# Patient Record
Sex: Female | Born: 1935 | Race: Black or African American | Hispanic: No | Marital: Single | Smoking: Never smoker
Health system: Southern US, Community
[De-identification: ages and names within clinical notes are randomized; demographics above are authoritative.]

## PROBLEM LIST (undated history)

## (undated) DIAGNOSIS — E079 Disorder of thyroid, unspecified: Secondary | ICD-10-CM

---

## 2020-07-12 ENCOUNTER — Inpatient Hospital Stay (HOSPITAL_COMMUNITY)
Admission: EM | Admit: 2020-07-12 | Discharge: 2020-08-15 | DRG: 177 | Disposition: E | Payer: Medicare (Managed Care) | Source: Ambulatory Visit | Attending: Internal Medicine | Admitting: Internal Medicine

## 2020-07-12 ENCOUNTER — Emergency Department (HOSPITAL_COMMUNITY): Payer: Medicare (Managed Care)

## 2020-07-12 ENCOUNTER — Encounter (HOSPITAL_COMMUNITY): Payer: Self-pay | Admitting: Internal Medicine

## 2020-07-12 DIAGNOSIS — R627 Adult failure to thrive: Secondary | ICD-10-CM | POA: Diagnosis present

## 2020-07-12 DIAGNOSIS — Z515 Encounter for palliative care: Secondary | ICD-10-CM

## 2020-07-12 DIAGNOSIS — R54 Age-related physical debility: Secondary | ICD-10-CM | POA: Diagnosis present

## 2020-07-12 DIAGNOSIS — J9601 Acute respiratory failure with hypoxia: Secondary | ICD-10-CM | POA: Diagnosis present

## 2020-07-12 DIAGNOSIS — E1122 Type 2 diabetes mellitus with diabetic chronic kidney disease: Secondary | ICD-10-CM | POA: Diagnosis present

## 2020-07-12 DIAGNOSIS — Z6834 Body mass index (BMI) 34.0-34.9, adult: Secondary | ICD-10-CM

## 2020-07-12 DIAGNOSIS — E8770 Fluid overload, unspecified: Secondary | ICD-10-CM | POA: Diagnosis not present

## 2020-07-12 DIAGNOSIS — G9341 Metabolic encephalopathy: Secondary | ICD-10-CM | POA: Diagnosis present

## 2020-07-12 DIAGNOSIS — E876 Hypokalemia: Secondary | ICD-10-CM | POA: Diagnosis present

## 2020-07-12 DIAGNOSIS — IMO0002 Reserved for concepts with insufficient information to code with codable children: Secondary | ICD-10-CM

## 2020-07-12 DIAGNOSIS — G9340 Encephalopathy, unspecified: Secondary | ICD-10-CM

## 2020-07-12 DIAGNOSIS — Z66 Do not resuscitate: Secondary | ICD-10-CM | POA: Diagnosis not present

## 2020-07-12 DIAGNOSIS — N1832 Chronic kidney disease, stage 3b: Secondary | ICD-10-CM | POA: Diagnosis present

## 2020-07-12 DIAGNOSIS — J80 Acute respiratory distress syndrome: Secondary | ICD-10-CM | POA: Diagnosis present

## 2020-07-12 DIAGNOSIS — R0602 Shortness of breath: Secondary | ICD-10-CM | POA: Diagnosis not present

## 2020-07-12 DIAGNOSIS — I429 Cardiomyopathy, unspecified: Secondary | ICD-10-CM | POA: Diagnosis present

## 2020-07-12 DIAGNOSIS — R29810 Facial weakness: Secondary | ICD-10-CM | POA: Diagnosis not present

## 2020-07-12 DIAGNOSIS — R0600 Dyspnea, unspecified: Secondary | ICD-10-CM | POA: Diagnosis not present

## 2020-07-12 DIAGNOSIS — Z781 Physical restraint status: Secondary | ICD-10-CM

## 2020-07-12 DIAGNOSIS — R4182 Altered mental status, unspecified: Secondary | ICD-10-CM | POA: Diagnosis not present

## 2020-07-12 DIAGNOSIS — R778 Other specified abnormalities of plasma proteins: Secondary | ICD-10-CM

## 2020-07-12 DIAGNOSIS — Z7189 Other specified counseling: Secondary | ICD-10-CM | POA: Diagnosis not present

## 2020-07-12 DIAGNOSIS — I5041 Acute combined systolic (congestive) and diastolic (congestive) heart failure: Secondary | ICD-10-CM | POA: Diagnosis not present

## 2020-07-12 DIAGNOSIS — I5021 Acute systolic (congestive) heart failure: Secondary | ICD-10-CM | POA: Diagnosis not present

## 2020-07-12 DIAGNOSIS — E669 Obesity, unspecified: Secondary | ICD-10-CM | POA: Diagnosis present

## 2020-07-12 DIAGNOSIS — N179 Acute kidney failure, unspecified: Secondary | ICD-10-CM | POA: Diagnosis present

## 2020-07-12 DIAGNOSIS — U071 COVID-19: Principal | ICD-10-CM

## 2020-07-12 DIAGNOSIS — E1165 Type 2 diabetes mellitus with hyperglycemia: Secondary | ICD-10-CM | POA: Diagnosis present

## 2020-07-12 DIAGNOSIS — J1282 Pneumonia due to coronavirus disease 2019: Secondary | ICD-10-CM | POA: Diagnosis present

## 2020-07-12 DIAGNOSIS — R7989 Other specified abnormal findings of blood chemistry: Secondary | ICD-10-CM

## 2020-07-12 DIAGNOSIS — D72819 Decreased white blood cell count, unspecified: Secondary | ICD-10-CM | POA: Diagnosis present

## 2020-07-12 DIAGNOSIS — E039 Hypothyroidism, unspecified: Secondary | ICD-10-CM | POA: Diagnosis present

## 2020-07-12 DIAGNOSIS — R413 Other amnesia: Secondary | ICD-10-CM | POA: Diagnosis present

## 2020-07-12 DIAGNOSIS — I444 Left anterior fascicular block: Secondary | ICD-10-CM | POA: Diagnosis present

## 2020-07-12 HISTORY — DX: Disorder of thyroid, unspecified: E07.9

## 2020-07-12 LAB — COMPREHENSIVE METABOLIC PANEL
ALT: 32 U/L (ref 0–44)
AST: 64 U/L — ABNORMAL HIGH (ref 15–41)
Albumin: 3.3 g/dL — ABNORMAL LOW (ref 3.5–5.0)
Alkaline Phosphatase: 41 U/L (ref 38–126)
Anion gap: 10 (ref 5–15)
BUN: 13 mg/dL (ref 8–23)
CO2: 27 mmol/L (ref 22–32)
Calcium: 8.5 mg/dL — ABNORMAL LOW (ref 8.9–10.3)
Chloride: 100 mmol/L (ref 98–111)
Creatinine, Ser: 1.22 mg/dL — ABNORMAL HIGH (ref 0.44–1.00)
GFR, Estimated: 44 mL/min — ABNORMAL LOW (ref >60)
Glucose, Bld: 188 mg/dL — ABNORMAL HIGH (ref 70–99)
Potassium: 4.7 mmol/L (ref 3.5–5.1)
Sodium: 137 mmol/L (ref 135–145)
Total Bilirubin: 1.2 mg/dL (ref 0.3–1.2)
Total Protein: 6.6 g/dL (ref 6.5–8.1)

## 2020-07-12 LAB — FERRITIN: Ferritin: 521 ng/mL — ABNORMAL HIGH (ref 11–307)

## 2020-07-12 LAB — I-STAT ARTERIAL BLOOD GAS, ED
Acid-Base Excess: 7 mmol/L — ABNORMAL HIGH (ref 0.0–2.0)
Bicarbonate: 33.2 mmol/L — ABNORMAL HIGH (ref 20.0–28.0)
Calcium, Ion: 1.12 mmol/L — ABNORMAL LOW (ref 1.15–1.40)
HCT: 37 % (ref 36.0–46.0)
Hemoglobin: 12.6 g/dL (ref 12.0–15.0)
O2 Saturation: 94 %
Patient temperature: 97.9
Potassium: 3.8 mmol/L (ref 3.5–5.1)
Sodium: 138 mmol/L (ref 135–145)
TCO2: 35 mmol/L — ABNORMAL HIGH (ref 22–32)
pCO2 arterial: 49.7 mmHg — ABNORMAL HIGH (ref 32.0–48.0)
pH, Arterial: 7.431 (ref 7.350–7.450)
pO2, Arterial: 70 mmHg — ABNORMAL LOW (ref 83.0–108.0)

## 2020-07-12 LAB — CBC WITH DIFFERENTIAL/PLATELET
Abs Immature Granulocytes: 0.02 10*3/uL (ref 0.00–0.07)
Basophils Absolute: 0 10*3/uL (ref 0.0–0.1)
Basophils Relative: 0 %
Eosinophils Absolute: 0 10*3/uL (ref 0.0–0.5)
Eosinophils Relative: 0 %
HCT: 39.2 % (ref 36.0–46.0)
Hemoglobin: 12.7 g/dL (ref 12.0–15.0)
Immature Granulocytes: 1 %
Lymphocytes Relative: 20 %
Lymphs Abs: 0.5 10*3/uL — ABNORMAL LOW (ref 0.7–4.0)
MCH: 30.8 pg (ref 26.0–34.0)
MCHC: 32.4 g/dL (ref 30.0–36.0)
MCV: 94.9 fL (ref 80.0–100.0)
Monocytes Absolute: 0.1 10*3/uL (ref 0.1–1.0)
Monocytes Relative: 4 %
Neutro Abs: 2 10*3/uL (ref 1.7–7.7)
Neutrophils Relative %: 75 %
Platelets: 96 10*3/uL — ABNORMAL LOW (ref 150–400)
RBC: 4.13 MIL/uL (ref 3.87–5.11)
RDW: 14.7 % (ref 11.5–15.5)
WBC: 2.7 10*3/uL — ABNORMAL LOW (ref 4.0–10.5)
nRBC: 0 % (ref 0.0–0.2)

## 2020-07-12 LAB — TROPONIN I (HIGH SENSITIVITY)
Troponin I (High Sensitivity): 151 ng/L (ref <18)
Troponin I (High Sensitivity): 180 ng/L (ref <18)

## 2020-07-12 LAB — LACTIC ACID, PLASMA: Lactic Acid, Venous: 1.3 mmol/L (ref 0.5–1.9)

## 2020-07-12 LAB — BRAIN NATRIURETIC PEPTIDE: B Natriuretic Peptide: 134.8 pg/mL — ABNORMAL HIGH (ref 0.0–100.0)

## 2020-07-12 LAB — TRIGLYCERIDES: Triglycerides: 167 mg/dL — ABNORMAL HIGH (ref <150)

## 2020-07-12 LAB — C-REACTIVE PROTEIN: CRP: 6.6 mg/dL — ABNORMAL HIGH (ref <1.0)

## 2020-07-12 LAB — PROCALCITONIN: Procalcitonin: <0.1 ng/mL

## 2020-07-12 LAB — LACTATE DEHYDROGENASE: LDH: 600 U/L — ABNORMAL HIGH (ref 98–192)

## 2020-07-12 LAB — SARS CORONAVIRUS 2 BY RT PCR (HOSPITAL ORDER, PERFORMED IN ~~LOC~~ HOSPITAL LAB): SARS Coronavirus 2: POSITIVE — AB

## 2020-07-12 MED ORDER — ENOXAPARIN SODIUM 40 MG/0.4ML ~~LOC~~ SOLN
40.0000 mg | SUBCUTANEOUS | Status: DC
  Administered 2020-07-13: 40 mg via SUBCUTANEOUS
  Filled 2020-07-12: qty 0.4

## 2020-07-12 MED ORDER — FUROSEMIDE 10 MG/ML IJ SOLN
40.0000 mg | Freq: Two times a day (BID) | INTRAMUSCULAR | Status: DC
  Administered 2020-07-13 – 2020-07-14 (×3): 40 mg via INTRAVENOUS
  Filled 2020-07-12 (×3): qty 4

## 2020-07-12 MED ORDER — SODIUM CHLORIDE 0.9 % IV SOLN: 100.0000 mg | Freq: Once | INTRAVENOUS | Status: DC

## 2020-07-12 MED ORDER — ZINC SULFATE 220 (50 ZN) MG PO CAPS
220.0000 mg | ORAL_CAPSULE | Freq: Every day | ORAL | Status: DC
  Administered 2020-07-12 – 2020-07-14 (×3): 220 mg via ORAL
  Filled 2020-07-12 (×4): qty 1

## 2020-07-12 MED ORDER — SODIUM CHLORIDE 0.9 % IV SOLN
100.0000 mg | Freq: Every day | INTRAVENOUS | Status: AC
  Administered 2020-07-13 – 2020-07-16 (×4): 100 mg via INTRAVENOUS
  Filled 2020-07-12 (×4): qty 20

## 2020-07-12 MED ORDER — SODIUM CHLORIDE 0.9 % IV SOLN: 100.0000 mg | Freq: Every day | INTRAVENOUS | Status: DC

## 2020-07-12 MED ORDER — SODIUM CHLORIDE 0.9% FLUSH: 3.0000 mL | INTRAVENOUS | Status: DC | PRN

## 2020-07-12 MED ORDER — GUAIFENESIN-DM 100-10 MG/5ML PO SYRP: 10.0000 mL | ORAL_SOLUTION | ORAL | Status: DC | PRN

## 2020-07-12 MED ORDER — SODIUM CHLORIDE 0.9 % IV SOLN: 250.0000 mL | INTRAVENOUS | Status: DC | PRN

## 2020-07-12 MED ORDER — POLYETHYLENE GLYCOL 3350 17 G PO PACK: 17.0000 g | PACK | Freq: Every day | ORAL | Status: DC | PRN

## 2020-07-12 MED ORDER — ASCORBIC ACID 500 MG PO TABS
500.0000 mg | ORAL_TABLET | Freq: Every day | ORAL | Status: DC
  Administered 2020-07-12 – 2020-07-14 (×3): 500 mg via ORAL
  Filled 2020-07-12 (×4): qty 1

## 2020-07-12 MED ORDER — HALOPERIDOL 0.5 MG PO TABS
0.5000 mg | ORAL_TABLET | Freq: Four times a day (QID) | ORAL | Status: DC | PRN
  Administered 2020-07-12 – 2020-07-14 (×2): 0.5 mg via ORAL
  Filled 2020-07-12 (×3): qty 1

## 2020-07-12 MED ORDER — SODIUM CHLORIDE 0.9 % IV SOLN
200.0000 mg | Freq: Once | INTRAVENOUS | Status: AC
  Administered 2020-07-12: 200 mg via INTRAVENOUS
  Filled 2020-07-12: qty 40

## 2020-07-12 MED ORDER — SODIUM CHLORIDE 0.9% FLUSH
3.0000 mL | Freq: Two times a day (BID) | INTRAVENOUS | Status: DC
  Administered 2020-07-13 – 2020-07-21 (×17): 3 mL via INTRAVENOUS

## 2020-07-12 MED ORDER — DEXAMETHASONE SODIUM PHOSPHATE 10 MG/ML IJ SOLN
6.0000 mg | INTRAMUSCULAR | Status: DC
  Administered 2020-07-13: 6 mg via INTRAVENOUS
  Filled 2020-07-12: qty 1

## 2020-07-12 MED ORDER — SODIUM CHLORIDE 0.9% FLUSH
3.0000 mL | Freq: Two times a day (BID) | INTRAVENOUS | Status: DC
  Administered 2020-07-14 – 2020-07-21 (×13): 3 mL via INTRAVENOUS

## 2020-07-12 MED ORDER — METHYLPREDNISOLONE SODIUM SUCC 125 MG IJ SOLR
125.0000 mg | Freq: Once | INTRAMUSCULAR | Status: AC
  Administered 2020-07-12: 125 mg via INTRAVENOUS
  Filled 2020-07-12: qty 2

## 2020-07-12 MED ORDER — INSULIN ASPART 100 UNIT/ML ~~LOC~~ SOLN
0.0000 [IU] | Freq: Three times a day (TID) | SUBCUTANEOUS | Status: DC
  Administered 2020-07-13 (×2): 5 [IU] via SUBCUTANEOUS
  Administered 2020-07-13: 3 [IU] via SUBCUTANEOUS
  Administered 2020-07-14 (×3): 5 [IU] via SUBCUTANEOUS
  Administered 2020-07-15 (×2): 8 [IU] via SUBCUTANEOUS
  Administered 2020-07-15: 5 [IU] via SUBCUTANEOUS
  Administered 2020-07-16: 3 [IU] via SUBCUTANEOUS
  Administered 2020-07-16: 5 [IU] via SUBCUTANEOUS
  Administered 2020-07-16 – 2020-07-17 (×2): 3 [IU] via SUBCUTANEOUS
  Administered 2020-07-17: 5 [IU] via SUBCUTANEOUS
  Administered 2020-07-17: 3 [IU] via SUBCUTANEOUS
  Administered 2020-07-18: 8 [IU] via SUBCUTANEOUS
  Administered 2020-07-18 (×2): 5 [IU] via SUBCUTANEOUS
  Administered 2020-07-19: 3 [IU] via SUBCUTANEOUS
  Administered 2020-07-19: 5 [IU] via SUBCUTANEOUS
  Administered 2020-07-19: 3 [IU] via SUBCUTANEOUS
  Administered 2020-07-20: 5 [IU] via SUBCUTANEOUS
  Administered 2020-07-20: 8 [IU] via SUBCUTANEOUS
  Administered 2020-07-20: 3 [IU] via SUBCUTANEOUS

## 2020-07-12 MED ORDER — HYDROCOD POLST-CPM POLST ER 10-8 MG/5ML PO SUER
5.0000 mL | Freq: Two times a day (BID) | ORAL | Status: DC | PRN
  Administered 2020-07-12 – 2020-07-19 (×2): 5 mL via ORAL
  Filled 2020-07-12 (×2): qty 5

## 2020-07-12 MED ORDER — FUROSEMIDE 10 MG/ML IJ SOLN
40.0000 mg | Freq: Once | INTRAMUSCULAR | Status: AC
  Administered 2020-07-12: 40 mg via INTRAVENOUS
  Filled 2020-07-12: qty 4

## 2020-07-12 NOTE — ED Notes (Signed)
This RN rounded on pt, found pt sitting at edge of the bed with O2 off0 & SpO2 monitor off of finger, stating she wanted "to get closer to the TV." Staff easily able to reorient pt to proper position in bed, reattach O2 & new SpO2 monitor. This RN noticed IV in R forearm was looped in SpO2 cording; assessed patency & pt voiced pain. IV removed from site. IV in LAC tested & patent

## 2020-07-12 NOTE — ED Provider Notes (Signed)
Assumed from PA Rebekah Sponseller at shift change, please see her note for full details, but in brief 87 year old female arrives in acute respiratory failure, went to [facility] PCP for new patient visit today and was found to be hypoxic into the 70s.  Patient reports that she feels just fine, but daughter reports that she has been exhibiting increasing shortness of breath and difficulty breathing over the past month, worsening especially over the past few days.  Intermittent cough, no known fevers.  On arrival patient placed on nonrebreather on 15 L and maintaining sats in the 90s.  Daughter also reports that patient has had gradually increased confusion over the past weeks to months.  On arrival patient noted to have rales throughout with lower extremity swelling.  Patient unsure if she is ever been diagnosed with heart failure or any other heart or lung conditions, and daughter states she does not take any chronic medications.  Chest x-ray on arrival concerning for heart failure, the rest of patient's lab work is pending.  Plan: Covid and lab work pending, will require hospitalization.   Physical Exam  BP 133/87 Comment: [place] 5L  Pulse 71 Comment: [place] 5L  Temp 97.7 F (36.5 C) (Oral)   Resp 14 Comment: [place] 5L  SpO2 95% Comment:  5L  Physical Exam Vitals and nursing note reviewed.  Constitutional:      General: She is not in acute distress.    Appearance: She is obese. She is ill-appearing.     Comments: Alert, somewhat confused, but answering questions, ill-appearing but not in any acute distress.  HENT:     Head: Normocephalic and atraumatic.  Cardiovascular:     Rate and Rhythm: Normal rate and regular rhythm.  Pulmonary:     Comments: Patient with normal respiratory effort on 5 L nasal cannula, intermittently dropping into the 80s, improved on 6 L, lungs with rales noted bilaterally and some decreased air movement, no wheezing or rhonchi. Abdominal:     General: Bowel sounds are normal.      Palpations: Abdomen is soft.     Tenderness: There is no abdominal tenderness.  Musculoskeletal:     Cervical back: Neck supple.     Right lower leg: Edema present.     Left lower leg: Edema present.  Skin:    General: Skin is warm and dry.  Neurological:     Mental Status: She is alert.     ED Course/Procedures   Labs Reviewed  SARS CORONAVIRUS 2 BY RT PCR (HOSPITAL ORDER, PERFORMED IN [facility]) - Abnormal; Notable for the following components:      Result Value   SARS Coronavirus 2 POSITIVE (*)    All other components within normal limits  COMPREHENSIVE METABOLIC PANEL - Abnormal; Notable for the following components:   Glucose, Bld 188 (*)    Creatinine, Ser 1.22 (*)    Calcium 8.5 (*)    Albumin 3.3 (*)    AST 64 (*)    GFR, Estimated 44 (*)    All other components within normal limits  LACTATE DEHYDROGENASE - Abnormal; Notable for the following components:   LDH 600 (*)    All other components within normal limits  BRAIN NATRIURETIC PEPTIDE - Abnormal; Notable for the following components:   B Natriuretic Peptide 134.8 (*)    All other components within normal limits  TRIGLYCERIDES - Abnormal; Notable for the following components:   Triglycerides 167 (*)    All other components within normal  limits  CBC WITH DIFFERENTIAL/PLATELET - Abnormal; Notable for the following components:   WBC 2.7 (*)    Platelets 96 (*)    Lymphs Abs 0.5 (*)    All other components within normal limits  C-REACTIVE PROTEIN - Abnormal; Notable for the following components:   CRP 6.6 (*)    All other components within normal limits  FERRITIN - Abnormal; Notable for the following components:   Ferritin 521 (*)    All other components within normal limits  I-STAT ARTERIAL BLOOD GAS, ED - Abnormal; Notable for the following components:   pCO2 arterial 49.7 (*)    pO2, Arterial 70 (*)    Bicarbonate 33.2 (*)    TCO2 35 (*)    Acid-Base Excess 7.0 (*)    Calcium, Ion 1.12  (*)    All other components within normal limits  TROPONIN I (HIGH SENSITIVITY) - Abnormal; Notable for the following components:   Troponin I (High Sensitivity) 151 (*)    All other components within normal limits  TROPONIN I (HIGH SENSITIVITY) - Abnormal; Notable for the following components:   Troponin I (High Sensitivity) 180 (*)    All other components within normal limits  CULTURE, BLOOD (ROUTINE X 2)  CULTURE, BLOOD (ROUTINE X 2)  LACTIC ACID, PLASMA  PROCALCITONIN  LACTIC ACID, PLASMA  CBC WITH DIFFERENTIAL/PLATELET  CBC WITH DIFFERENTIAL/PLATELET  COMPREHENSIVE METABOLIC PANEL  C-REACTIVE PROTEIN  D-DIMER, QUANTITATIVE (NOT AT [facility])  FERRITIN  MAGNESIUM  PHOSPHORUS  HEMOGLOBIN A1C   EKG Interpretation  Date/Time:  Wednesday July 12 2020 15:38:47 EST Ventricular Rate:  77 PR Interval:    QRS Duration: 113 QT Interval:  389 QTC Calculation: 441 R Axis:   -74 Text Interpretation: Sinus rhythm Short PR interval Left anterior fascicular block Low voltage, precordial leads Consider anterior infarct Confirmed by Cherlynn Perches ([id number]) on 06/28/2020 5:59:50 PM  DG Chest Port 1 View  Result Date: 07/15/2020 CLINICAL DATA:  Short of breath EXAM: PORTABLE CHEST 1 VIEW COMPARISON:  None. FINDINGS: Single frontal view of the chest demonstrates an enlarged cardiac silhouette. There is diffuse increased interstitial prominence, with patchy areas of bilateral airspace disease seen at the lung bases. Trace left effusion is suspected. No pneumothorax. IMPRESSION: 1. Constellation of findings most suggestive of congestive heart failure. Electronically Signed   By: Sharlet Salina M.D.   On: 06/22/2020 14:55     .Critical Care Performed by: Dartha Lodge, PA-C Authorized by: Dartha Lodge, PA-C   Critical care provider statement:    Critical care time (minutes):  45   Critical care was necessary to treat or prevent imminent or life-threatening deterioration of the following  conditions:  Respiratory failure (Acute respiratory failure in the setting of Covid and likely heart failure)   Critical care was time spent personally by me on the following activities:  Discussions with consultants, evaluation of patient's response to treatment, examination of patient, ordering and performing treatments and interventions, ordering and review of laboratory studies, ordering and review of radiographic studies, pulse oximetry, re-evaluation of patient's condition, obtaining history from patient or surrogate and review of old charts    MDM    Patient arrives from PCPs office after she was found to be hypoxic in the 70s when she arrived for a new patient visit today, unknown if any past medical history, patient has not seen a doctor in quite some time is not on any chronic medications.  Chest x-ray concerning for possible heart failure.  Patient  is also found to be Covid positive.  Suspect patient's acute respiratory failure is multifactorial.  She has multiple elevated inflammatory markers.  Troponins elevated at 151 and 180 but EKG without focal ischemic changes, and patient denies any chest pain.  Suspect this troponin leak is likely more so in the setting of acute illness from Covid and potential heart failure.  Patient started on Lasix, Solu-Medrol and remdesivir and will require hospital admission.  She is unvaccinated and is at high risk for worsening and severe disease with Covid.  Patient's daughter is at bedside and is in agreement with this plan.  Case discussed with Dr. Alinda Money with [facility], who will see and admit the patient.      Legrand Rams 07/14/2020 2138    Sabino Donovan, MD 07/15/2020 2250

## 2020-07-12 NOTE — ED Notes (Signed)
IV team at bedside 

## 2020-07-12 NOTE — ED Notes (Signed)
Daughter Rinaldo Cloud [phone] would like any/all necessary updates

## 2020-07-12 NOTE — ED Notes (Signed)
Failed attempt to collect labs. RN was notified

## 2020-07-12 NOTE — ED Notes (Signed)
Pt's O2 sats maintained mid-80's, O2 increased to 6L [place], pt tolerating well

## 2020-07-12 NOTE — ED Notes (Signed)
Pt and pt's family member hesitant about pt being stuck again to obtain blood. Labs not drawn by this Tech.

## 2020-07-12 NOTE — H&P (Signed)
History and Physical   Kaitlyn Novak FMB:[id number] DOB: [birth year] DOA: Jul 31, 2020  PCP: No primary care provider on file.  [facility], establishing today.  Patient coming from: [facility] physicians office, was there to establish care today.  Chief Complaint: Shortness of breath, hypoxia  HPI: Kaitlyn Novak is a 87 y.o. female with unclear past medical history (?thyroid disease) who presents from [place] primary care due to respiratory distress and hypoxia.  Patient recently moved in with her daughter from [place] over the summer.  Patient reportedly saw a doctor in [place] but is unable to list any medical conditions that she has her medications she takes.  Per her daughter she only takes a daily vitamin at this time.  She went to [facility] physicians to present for slowly worsening shortness of breath per her daughter for the past month and to establish care and was found to be hypoxic into the 70s with increased work of breathing.  EMS was contacted so the patient could transport to the ED and she was started on 15 L nonrebreather and saturating 100%.  As above daughter states she has had some slowly worsening shortness of breath for about a month.  Daughter also reports patient's memory and mentation has been slowly worsening for at least several months.  Denies fevers, cough, chest pain, abdominal pain, nausea, vomiting.  ED Course: Vital signs in ED significant for requiring 5 to 6 L to maintain saturations in the 90s.  Vital signs otherwise stable.  Lab work-up showed CMP with creatinine of 1.22 with unclear baseline, glucose 188, calcium 8.5 which corrects considering albumin of 3.3, AST 64, WBC 2.7 on CBC along with platelets of 96.  Troponin I 51 on initial check with repeat pending.  BMP pending.  Lactic acid normal with repeat pending.  Positive for Covid.  ABG with pH 7.43 and PCO2 49.  Blood cultures pending.  Procalcitonin 2.1, LDH 600, triglycerides 167, CRP and ferritin pending.   Patient started on remdesivir, steroids and given a dose of Lasix in the ED.  Review of Systems: As per HPI otherwise all other systems reviewed and are negative  Past Medical History:  Diagnosis Date  . Thyroid disease     History reviewed. No pertinent surgical history.  Social History  has no history on file for tobacco use, alcohol use, and drug use.  No Known Allergies  Family History  Problem Relation Age of Onset  . Lung disease Father   Reviewed on admission  Prior to Admission medications   Not on File  Multivitamin  Physical Exam: Vitals:   2020-07-31 1945 2020/07/31 2000 2020-07-31 2006 07/31/20 2100  BP: 116/71 (!) 135/98  (!) 142/89  Pulse: 75 81  73  Resp: 18 20  18   Temp:   97.9 F (36.6 C)   TempSrc:   Oral   SpO2: 94% (!) 86%  93%   Physical Exam Constitutional:      General: She is not in acute distress.    Appearance: Normal appearance. She is obese.  HENT:     Head: Normocephalic and atraumatic.     Mouth/Throat:     Mouth: Mucous membranes are moist.     Pharynx: Oropharynx is clear.  Eyes:     Extraocular Movements: Extraocular movements intact.     Pupils: Pupils are equal, round, and reactive to light.  Cardiovascular:     Rate and Rhythm: Normal rate and regular rhythm.     Pulses: Normal pulses.  Heart sounds: Normal heart sounds.  Pulmonary:     Effort: Pulmonary effort is normal. No respiratory distress.     Breath sounds: Rales present.  Abdominal:     General: Bowel sounds are normal. There is no distension.     Palpations: Abdomen is soft.     Tenderness: There is no abdominal tenderness.  Musculoskeletal:        General: No swelling or deformity.     Right lower leg: Edema present.     Left lower leg: Edema present.  Skin:    General: Skin is warm and dry.  Neurological:     General: No focal deficit present.     Mental Status: Mental status is at baseline.     Comments: Slow responses, some apparent memory deficits.     Labs on Admission: I have personally reviewed following labs and imaging studies  CBC: Recent Labs  Lab 07/17/2020 1552 07/08/2020 1601  WBC 2.7*  --   NEUTROABS 2.0  --   HGB 12.7 12.6  HCT 39.2 37.0  MCV 94.9  --   PLT 96*  --     Basic Metabolic Panel: Recent Labs  Lab 06/22/2020 1552 06/24/2020 1601  NA 137 138  K 4.7 3.8  CL 100  --   CO2 27  --   GLUCOSE 188*  --   BUN 13  --   CREATININE 1.22*  --   CALCIUM 8.5*  --     GFR: CrCl cannot be calculated (Unknown ideal weight.).  Liver Function Tests: Recent Labs  Lab 06/19/2020 1552  AST 64*  ALT 32  ALKPHOS 41  BILITOT 1.2  PROT 6.6  ALBUMIN 3.3*    Urine analysis: No results found for: COLORURINE, APPEARANCEUR, LABSPEC, PHURINE, GLUCOSEU, HGBUR, BILIRUBINUR, KETONESUR, PROTEINUR, UROBILINOGEN, NITRITE, LEUKOCYTESUR  Radiological Exams on Admission: DG Chest Port 1 View  Result Date: 06/21/2020 CLINICAL DATA:  Short of breath EXAM: PORTABLE CHEST 1 VIEW COMPARISON:  None. FINDINGS: Single frontal view of the chest demonstrates an enlarged cardiac silhouette. There is diffuse increased interstitial prominence, with patchy areas of bilateral airspace disease seen at the lung bases. Trace left effusion is suspected. No pneumothorax. IMPRESSION: 1. Constellation of findings most suggestive of congestive heart failure. Electronically Signed   By: Sharlet Salina M.D.   On: 07/06/2020 14:55    EKG: Independently reviewed.  Sinus rhythm at 77 bpm, intraventricular conduction delay is a possibly consistent with left anterior fascicular block.  Apparent low voltage throughout and baseline artifact.  Assessment/Plan Principal Problem:   Acute respiratory failure with hypoxia (HCC) Active Problems:   Volume overload   COVID-19 virus infection   Troponin level elevated   Creatinine elevation  Volume overload COVID-19 infection Acute hypoxic respiratory failure > Patient has had a month of worsening shortness of  breath.  Is not vaccinated against COVID-19.  Unclear past medical history, but does not take medications. > Went to establish care and to be evaluated for dyspnea and found to be hypoxic to the 70s on room air. Improved to 90s on 4-5L > Imaging and exam consistent with volume overload suspicious for heart failure and COVID-19 infection > Has been started on remdesivir, steroids, Lasix in ED - Continue Decadron - Continue remdesivir - Pulmonary hygiene - As needed cough suppressant - Zinc, vitamin C - As needed inhaler - Daily CBC, CMP, CRP, D-dimer, ferritin, mag, Phos For volume overload component - Echocardiogram to evaluate for failure - Strict I/O, daily  weights - Lasix 40 mg twice daily - Follow-up BMP  Troponinemia > Troponin initially elevated to 151 with repeat pending, suspect demand ischemia in the setting of likely CHF.  No chest pain. - Trend troponin  Elevated creatinine > Creatinine elevated to 1.22 in the setting of unknown baseline - Trend renal function electrolytes - Avoid nephrotoxic agents  Elevated glucose > Glucose elevated to 188 in the setting of unclear medical history - Hemoglobin A1c - SSI  History of thyroid disease > Daughter states she remembers the patient taking Synthroid in the past - Check TSH  Memory deficit > Slowly worsening over months per patient's daughter, due to slow progression likely some form of dementia - Will need to be evaluated formally after acute processes have resolved/improved  DVT prophylaxis: Lovenox  Code Status:   Full, discussed at bedside, patient is unsure what she wants but daughter states she is told in the past she would not want to be DNR may need to clarify this over time.  Family Communication:  Discussed with daughter at bedside   Patient is from:  Home  Anticipated DC to:  Home  Anticipated DC date:  Pending clinical course  Anticipated DC barriers: None  Consults called:  None  Admission status:   Inpatient, progressive   Severity of Illness: The appropriate patient status for this patient is INPATIENT. Inpatient status is judged to be reasonable and necessary in order to provide the required intensity of service to ensure the patient's safety. The patient's presenting symptoms, physical exam findings, and initial radiographic and laboratory data in the context of their chronic comorbidities is felt to place them at high risk for further clinical deterioration. Furthermore, it is not anticipated that the patient will be medically stable for discharge from the hospital within 2 midnights of admission. The following factors support the patient status of inpatient.   " The patient's presenting symptoms include dyspnea. " The worrisome physical exam findings include rales diffusely, Edema. " The initial radiographic and laboratory data are worrisome because of creatinine 1.22, glucose 188, troponin I 51, Covid positive, bilateral patchy opacity and cardiomegaly with interstitial prominence on chest x-ray. " The chronic co-morbidities include none.   * I certify that at the point of admission it is my clinical judgment that the patient will require inpatient hospital care spanning beyond 2 midnights from the point of admission due to high intensity of service, high risk for further deterioration and high frequency of surveillance required.Synetta Fail MD [facility]  How to contact the [facility] Attending or Consulting provider 7A - 7P or covering provider during after hours 7P -7A, for this patient?   1. Check the care team in [facility] and look for a) attending/consulting [facility] provider listed and b) the [facility] team listed 2. Log into [email or web address] and use [place]'s universal password to access. If you do not have the password, please contact the hospital operator. 3. Locate the [facility] provider you are looking for under [facility] and page to a number that you can be directly  reached. 4. If you still have difficulty reaching the provider, please page the [facility] (Director on Call) for the Hospitalists listed on amion for assistance.  07/14/2020, 9:08 PM

## 2020-07-12 NOTE — ED Notes (Signed)
Updated EDP on labs. IV also had difficulties obtaining blood.

## 2020-07-12 NOTE — ED Notes (Signed)
3 unsuccessful IV attempts and NT attempted to draw blood as well.

## 2020-07-12 NOTE — ED Provider Notes (Signed)
I provided a substantive portion of the care of this patient.  I personally performed the entirety of the medical decision making for this encounter.    87 year old female presents with shortness of breath from doctor's office.  Patient states he has been short of breath for several days.  She is not vaccinated against Covid.  Coarse breath sounds on exam.  Suspect that patient has active Covid infection.  Will order labs and x-rays and patient will require admission   Lorre Nick, MD 07/10/2020 1431

## 2020-07-12 NOTE — ED Provider Notes (Addendum)
[facility] EMERGENCY DEPARTMENT Provider Note   CSN: [id number] Arrival date & time: 08-01-20  1426     History No chief complaint on file.   Kaitlyn Novak is a 87 y.o. female who presents via EMS from [place] [facility] for respiratory distress.  Per EMS report patient was presenting to them as a new patient and they pulled her back to do intake vital signs.  At that time she was found to be hypoxic to the 70s with significant increased work of breathing.  Patient was placed on nonrebreather at 15 L supplemental oxygen, with improvement of her saturations to 1 her percent.  Patient was normotensive for EMS, NSR on EKG with them.  At the time initial evaluation patient was resting comfortably in her hospital bed with NRB in place.  Patient continues to remove NRB.  Patient denies any difficulty breathing, denies chest pain, nausea, vomiting, diarrhea, palpitations.  Patient states she has never had any issue breathing.  When asked what medications she takes she says vitamins.   I personally reviewed this patient's medical records.  Patient is new to our system and there are no medical diagnoses or medications listed on her chart. I have been unable to contact patient's family member for collateral info.  Patient unable to provide any further insight into her medical history  HPI     No past medical history on file.  There are no problems to display for this patient.      OB History   No obstetric history on file.     No family history on file.     Home Medications Prior to Admission medications   Not on File    Allergies    Patient has no allergy information on record.  Review of Systems   Review of Systems  Unable to perform ROS: Dementia    Physical Exam Updated Vital Signs BP 125/76 (BP Location: Right Arm)   Pulse 80   Temp 97.7 F (36.5 C) (Oral)   Resp 18   SpO2 95%   Physical Exam Vitals and nursing note reviewed.  Constitutional:       Appearance: She is obese.  HENT:     Head: Normocephalic and atraumatic.     Nose: Nose normal.     Mouth/Throat:     Mouth: Mucous membranes are moist.     Pharynx: Oropharynx is clear. Uvula midline. No oropharyngeal exudate or posterior oropharyngeal erythema.     Tonsils: No tonsillar exudate.  Eyes:     General: Lids are normal. Vision grossly intact.        Right eye: No discharge.        Left eye: No discharge.     Extraocular Movements: Extraocular movements intact.     Conjunctiva/sclera: Conjunctivae normal.     Pupils: Pupils are equal, round, and reactive to light.  Neck:     Trachea: Trachea and phonation normal.  Cardiovascular:     Rate and Rhythm: Normal rate and regular rhythm.     Pulses: Normal pulses.          Radial pulses are 2+ on the right side and 2+ on the left side.       Dorsalis pedis pulses are 2+ on the right side and 2+ on the left side.     Heart sounds: Normal heart sounds. No murmur heard.   Pulmonary:     Effort: Pulmonary effort is normal. No tachypnea, prolonged expiration or respiratory  distress.     Breath sounds: Examination of the right-upper field reveals rales. Examination of the right-middle field reveals rales. Examination of the left-middle field reveals rales. Examination of the right-lower field reveals wheezing. Examination of the left-lower field reveals wheezing. Wheezing and rales present.  Chest:     Chest wall: No deformity, swelling, tenderness, crepitus or edema.  Abdominal:     General: Bowel sounds are normal. There is no distension.     Palpations: Abdomen is soft.     Tenderness: There is no abdominal tenderness. There is no guarding or rebound.  Musculoskeletal:        General: No deformity.     Cervical back: Neck supple. No rigidity or crepitus. No pain with movement or spinous process tenderness.     Right lower leg: 1+ Pitting Edema present.     Left lower leg: 2+ Pitting Edema present.  Lymphadenopathy:      Cervical: No cervical adenopathy.  Skin:    General: Skin is warm and dry.     Capillary Refill: Capillary refill takes less than 2 seconds.  Neurological:     General: No focal deficit present.     Mental Status: She is alert and oriented to person, place, and time.     Sensory: Sensation is intact.     Motor: Motor function is intact.  Psychiatric:        Mood and Affect: Mood normal.     ED Results / Procedures / Treatments   Labs (all labs ordered are listed, but only abnormal results are displayed) Labs Reviewed  SARS CORONAVIRUS 2 BY RT PCR (HOSPITAL ORDER, PERFORMED IN [facility])  SARS CORONAVIRUS 2 BY RT PCR (HOSPITAL ORDER, PERFORMED IN [facility])  CULTURE, BLOOD (ROUTINE X 2)  CULTURE, BLOOD (ROUTINE X 2)  LACTIC ACID, PLASMA  LACTIC ACID, PLASMA  CBC WITH DIFFERENTIAL/PLATELET  COMPREHENSIVE METABOLIC PANEL  D-DIMER, QUANTITATIVE (NOT AT [facility])  PROCALCITONIN  LACTATE DEHYDROGENASE  FERRITIN  TRIGLYCERIDES  FIBRINOGEN  C-REACTIVE PROTEIN  BRAIN NATRIURETIC PEPTIDE  I-STAT ARTERIAL BLOOD GAS, ED  TROPONIN I (HIGH SENSITIVITY)    EKG None  Radiology DG Chest Port 1 View  Result Date: 07/13/2020 CLINICAL DATA:  Short of breath EXAM: PORTABLE CHEST 1 VIEW COMPARISON:  None. FINDINGS: Single frontal view of the chest demonstrates an enlarged cardiac silhouette. There is diffuse increased interstitial prominence, with patchy areas of bilateral airspace disease seen at the lung bases. Trace left effusion is suspected. No pneumothorax. IMPRESSION: 1. Constellation of findings most suggestive of congestive heart failure. Electronically Signed   By: Sharlet Salina M.D.   On: 07/10/2020 14:55    Procedures Procedures   Medications Ordered in ED Medications  sodium chloride flush (NS) 0.9 % injection 3 mL (has no administration in time range)  sodium chloride flush (NS) 0.9 % injection 3 mL (has no administration in time range)  0.9  %  sodium chloride infusion (has no administration in time range)    ED Course  I have reviewed the triage vital signs and the nursing notes.  Pertinent labs & imaging results that were available during my care of the patient were reviewed by me and considered in my medical decision making (see chart for details).    MDM Rules/Calculators/A&P                         46 old female presents from primary care office  for respiratory distress.  Was found to be hypoxic to the 70s on intake in their office.  Presented via EMS.  This provider took report from EMS directly.  Vital signs are normal on intake.  Patient maintaining saturation of 95% on nonrebreather 15 L supplemental oxygen.  To my initial exam patient normotensive, she is not tachycardic.  Cardiac exam unremarkable, pulmonary exam significant for wheezing in lung bases bilaterally as well as rales in the mid and upper lung fields bilaterally.  Abdominal exam is benign.  Patient with bilateral lower extremity edema, L>R.  Patient is not vaccinated against COVID-19. COVID-19 preadmission order set placed, suspect that this is the etiology for this patient symptoms.  Care of this patient signed out to oncoming ED provider, Jodi Geralds, PA-C at time of shift change.  All pertinent HPI and vital signs were reviewed with her.  No laboratory studies to review at this time.  I appreciate her collaboration in the care of this patient.  Cyndi Montejano was evaluated in Emergency Department on 07/11/2020 for the symptoms described in the history of present illness. She was evaluated in the context of the global COVID-19 pandemic, which necessitated consideration that the patient might be at risk for infection with the SARS-CoV-2 virus that causes COVID-19. Institutional protocols and algorithms that pertain to the evaluation of patients at risk for COVID-19 are in a state of rapid change based on information released by regulatory bodies including the  CDC and federal and state organizations. These policies and algorithms were followed during the patient's care in the ED.  This chart was dictated using voice recognition software, Dragon. Despite the best efforts of this provider to proofread and correct errors, errors may still occur which can change documentation meaning.  Final Clinical Impression(s) / ED Diagnoses Final diagnoses:  None    Rx / DC Orders ED Discharge Orders    None       Paris Lore, PA-C 06/17/2020 1530    Treasure Ochs, Eugene Gavia, PA-C 07/17/2020 1602    Lorre Nick, MD 07/14/20 [phone]

## 2020-07-13 ENCOUNTER — Inpatient Hospital Stay (HOSPITAL_COMMUNITY): Payer: Medicare (Managed Care)

## 2020-07-13 DIAGNOSIS — R0600 Dyspnea, unspecified: Secondary | ICD-10-CM

## 2020-07-13 DIAGNOSIS — J9601 Acute respiratory failure with hypoxia: Secondary | ICD-10-CM | POA: Diagnosis not present

## 2020-07-13 LAB — COMPREHENSIVE METABOLIC PANEL
ALT: 31 U/L (ref 0–44)
AST: 57 U/L — ABNORMAL HIGH (ref 15–41)
Albumin: 3.2 g/dL — ABNORMAL LOW (ref 3.5–5.0)
Alkaline Phosphatase: 31 U/L — ABNORMAL LOW (ref 38–126)
Anion gap: 12 (ref 5–15)
BUN: 15 mg/dL (ref 8–23)
CO2: 24 mmol/L (ref 22–32)
Calcium: 8.2 mg/dL — ABNORMAL LOW (ref 8.9–10.3)
Chloride: 102 mmol/L (ref 98–111)
Creatinine, Ser: 1.43 mg/dL — ABNORMAL HIGH (ref 0.44–1.00)
GFR, Estimated: 36 mL/min — ABNORMAL LOW (ref >60)
Glucose, Bld: 191 mg/dL — ABNORMAL HIGH (ref 70–99)
Potassium: 4.7 mmol/L (ref 3.5–5.1)
Sodium: 138 mmol/L (ref 135–145)
Total Bilirubin: 1.3 mg/dL — ABNORMAL HIGH (ref 0.3–1.2)
Total Protein: 6.5 g/dL (ref 6.5–8.1)

## 2020-07-13 LAB — ECHOCARDIOGRAM LIMITED
AV Peak grad: 3.3 mmHg
Ao pk vel: 0.91 m/s
Area-P 1/2: 3.85 cm2
S' Lateral: 2.8 cm
Single Plane A4C EF: 57.3 %

## 2020-07-13 LAB — CBG MONITORING, ED
Glucose-Capillary: 185 mg/dL — ABNORMAL HIGH (ref 70–99)
Glucose-Capillary: 215 mg/dL — ABNORMAL HIGH (ref 70–99)

## 2020-07-13 LAB — CBC WITH DIFFERENTIAL/PLATELET
Abs Immature Granulocytes: 0.05 10*3/uL (ref 0.00–0.07)
Basophils Absolute: 0 10*3/uL (ref 0.0–0.1)
Basophils Relative: 0 %
Eosinophils Absolute: 0 10*3/uL (ref 0.0–0.5)
Eosinophils Relative: 0 %
HCT: 37.3 % (ref 36.0–46.0)
Hemoglobin: 12.2 g/dL (ref 12.0–15.0)
Immature Granulocytes: 1 %
Lymphocytes Relative: 13 %
Lymphs Abs: 0.5 10*3/uL — ABNORMAL LOW (ref 0.7–4.0)
MCH: 30.7 pg (ref 26.0–34.0)
MCHC: 32.7 g/dL (ref 30.0–36.0)
MCV: 93.7 fL (ref 80.0–100.0)
Monocytes Absolute: 0.2 10*3/uL (ref 0.1–1.0)
Monocytes Relative: 5 %
Neutro Abs: 3.2 10*3/uL (ref 1.7–7.7)
Neutrophils Relative %: 81 %
Platelets: 152 10*3/uL (ref 150–400)
RBC: 3.98 MIL/uL (ref 3.87–5.11)
RDW: 14.4 % (ref 11.5–15.5)
WBC: 4 10*3/uL (ref 4.0–10.5)
nRBC: 0 % (ref 0.0–0.2)

## 2020-07-13 LAB — PHOSPHORUS: Phosphorus: 3.7 mg/dL (ref 2.5–4.6)

## 2020-07-13 LAB — TROPONIN I (HIGH SENSITIVITY): Troponin I (High Sensitivity): 152 ng/L (ref <18)

## 2020-07-13 LAB — TSH: TSH: 6.72 u[IU]/mL — ABNORMAL HIGH (ref 0.350–4.500)

## 2020-07-13 LAB — D-DIMER, QUANTITATIVE: D-Dimer, Quant: 1.69 ug/mL-FEU — ABNORMAL HIGH (ref 0.00–0.50)

## 2020-07-13 LAB — FERRITIN: Ferritin: 578 ng/mL — ABNORMAL HIGH (ref 11–307)

## 2020-07-13 LAB — C-REACTIVE PROTEIN: CRP: 7.9 mg/dL — ABNORMAL HIGH (ref <1.0)

## 2020-07-13 LAB — HEMOGLOBIN A1C
Hgb A1c MFr Bld: 8.6 % — ABNORMAL HIGH (ref 4.8–5.6)
Mean Plasma Glucose: 200.12 mg/dL

## 2020-07-13 LAB — GLUCOSE, CAPILLARY
Glucose-Capillary: 230 mg/dL — ABNORMAL HIGH (ref 70–99)
Glucose-Capillary: 253 mg/dL — ABNORMAL HIGH (ref 70–99)

## 2020-07-13 LAB — MAGNESIUM: Magnesium: 2 mg/dL (ref 1.7–2.4)

## 2020-07-13 MED ORDER — LEVOTHYROXINE SODIUM 25 MCG PO TABS
25.0000 ug | ORAL_TABLET | Freq: Every day | ORAL | Status: DC
  Administered 2020-07-13 – 2020-07-15 (×3): 25 ug via ORAL
  Filled 2020-07-13 (×4): qty 1

## 2020-07-13 MED ORDER — DEXAMETHASONE SODIUM PHOSPHATE 10 MG/ML IJ SOLN
10.0000 mg | INTRAMUSCULAR | Status: DC
  Administered 2020-07-14: 10 mg via INTRAVENOUS
  Filled 2020-07-13: qty 1

## 2020-07-13 NOTE — Plan of Care (Signed)
  Problem: Education: Goal: Knowledge of risk factors and measures for prevention of condition will improve Outcome: Progressing   Problem: Coping: Goal: Psychosocial and spiritual needs will be supported Outcome: Progressing   Problem: Respiratory: Goal: Will maintain a patent airway Outcome: Progressing Goal: Complications related to the disease process, condition or treatment will be avoided or minimized Outcome: Progressing   

## 2020-07-13 NOTE — ED Notes (Signed)
Pts daughter at bedside  

## 2020-07-13 NOTE — Progress Notes (Signed)
  Echocardiogram 2D Echocardiogram has been performed.  Pieter Partridge 07/13/2020, 2:11 PM

## 2020-07-13 NOTE — ED Notes (Signed)
Attempted to call report x 1  

## 2020-07-13 NOTE — Progress Notes (Signed)
PROGRESS NOTE    Kaitlyn Novak  YWV:[id number] DOB: 09/13/1933 DOA: 06/25/2020 PCP: Patient, No Pcp Per   Brief Narrative: 87 year old unclear past medical history, thyroid disease, presents from [place] primary care due to respiratory distress and hypoxia.  Patient recently moved in with her daughter from [place] over the summer.  Patient currently only taking daily vitamins at this time.  She went to [facility] physician for evaluation of a slowly worsening shortness of breath over the last month  and establish care.  Patient was found to be hypoxic oxygen saturation in the 70s, increased work of breathing.  EMS was contacted, patient was a started on 15 L nonrebreather.  Still having worsening memory problems.  Evaluation in the ED: Patient oxygen saturation 90 on 6 L, creatinine 1.2, white blood cell 2.7, platelet 96, troponin 51.  Covid PCR positive.  Procalcitonin 0.1    Assessment & Plan:   Principal Problem:   Acute respiratory failure with hypoxia (HCC) Active Problems:   Volume overload   COVID-19 virus infection   Troponin level elevated   Creatinine elevation   1-Acute hypoxic respiratory failure, COVID-19 pneumonia, pulmonary edema volume overload -Continue with IV Lasix.  -Continue with remdesivir. -Continue with IV Decadron -Continue with vitamin C and zinc inhaler -Continue to follow inflammatory markers COVID-19 Labs  Recent Labs    07/15/2020 1552 07/13/20 0451  DDIMER  --  1.69*  FERRITIN 521* 578*  LDH 600*  --   CRP 6.6* 7.9*    Lab Results  Component Value Date   SARSCOV2NAA POSITIVE (A) 06/17/2020   2-Volume overload, lower extremity edema, pulmonary edema: Likely secondary  to heart failure Echo pending  3-Leukopenia; secondary to covid.   4-Mild elevation troponin. Chest pain free.  Related to covid vs heart failure.   5-Hypothyroidism;  TSH at 6. Start synthroid.   6-Diabetes type 2;  HbA1c at 8.6 Start SSI.   AKI;  Monitor on lasix.    Memory deficit;  Start synthroid.  Check B12.   There is no height or weight on file to calculate BMI.   DVT prophylaxis: Lovenox.  Code Status: Full code Family Communication:  Disposition Plan:  Status is: Inpatient  Remains inpatient appropriate because:IV treatments appropriate due to intensity of illness or inability to take PO   Dispo: The patient is from: Home              Anticipated d/c is to: Home              Anticipated d/c date is: 3 days              Patient currently is not medically stable to d/c.   Difficult to place patient No        Consultants:   None  Procedures:   ECHO pending.   Antimicrobials:    Subjective: Report breathing better. Report cough,   Objective: Vitals:   07/13/20 0755 07/13/20 1100 07/13/20 1153 07/13/20 1200  BP: (!) 137/100 135/87 124/85 127/78  Pulse: 66 72 68 70  Resp: 18 15 16 15   Temp: 98.2 F (36.8 C)  (!) 97.2 F (36.2 C)   TempSrc: Oral  Oral   SpO2: 96% 96% 92% 93%    Intake/Output Summary (Last 24 hours) at 07/13/2020 1448 Last data filed at 07/13/2020 1139 Gross per 24 hour  Intake 347.45 ml  Output --  Net 347.45 ml   There were no vitals filed for this visit.  Examination:  General exam: Appears calm and comfortable  Respiratory system: B/L crackles.  Cardiovascular system: S1 & S2 heard, RRR. No JVD, murmurs, rubs, gallops or clicks. No pedal edema. Gastrointestinal system: Abdomen is nondistended, soft and nontender. No organomegaly or masses felt. Normal bowel sounds heard. Central nervous system: Alert and oriented.  Extremities: Symmetric 5 x 5 power.    Data Reviewed: I have personally reviewed following labs and imaging studies  CBC: Recent Labs  Lab 07/09/2020 1552 06/26/2020 1601  WBC 2.7*  --   NEUTROABS 2.0  --   HGB 12.7 12.6  HCT 39.2 37.0  MCV 94.9  --   PLT 96*  --    Basic Metabolic Panel: Recent Labs  Lab 07/13/2020 1552 07/16/2020 1601 07/13/20 0451  NA 137  138 138  K 4.7 3.8 4.7  CL 100  --  102  CO2 27  --  24  GLUCOSE 188*  --  191*  BUN 13  --  15  CREATININE 1.22*  --  1.43*  CALCIUM 8.5*  --  8.2*  MG  --   --  2.0  PHOS  --   --  3.7   GFR: CrCl cannot be calculated (Unknown ideal weight.). Liver Function Tests: Recent Labs  Lab 06/24/2020 1552 07/13/20 0451  AST 64* 57*  ALT 32 31  ALKPHOS 41 31*  BILITOT 1.2 1.3*  PROT 6.6 6.5  ALBUMIN 3.3* 3.2*   No results for input(s): LIPASE, AMYLASE in the last 168 hours. No results for input(s): AMMONIA in the last 168 hours. Coagulation Profile: No results for input(s): INR, PROTIME in the last 168 hours. Cardiac Enzymes: No results for input(s): CKTOTAL, CKMB, CKMBINDEX, TROPONINI in the last 168 hours. BNP (last 3 results) No results for input(s): PROBNP in the last 8760 hours. HbA1C: Recent Labs    07/13/20 0452  HGBA1C 8.6*   CBG: Recent Labs  Lab 07/13/20 0752 07/13/20 1157  GLUCAP 185* 215*   Lipid Profile: Recent Labs    07/02/2020 1552  TRIG 167*   Thyroid Function Tests: Recent Labs    07/13/20 0453  TSH 6.720*   Anemia Panel: Recent Labs    07/08/2020 1552 07/13/20 0451  FERRITIN 521* 578*   Sepsis Labs: Recent Labs  Lab 06/21/2020 1552  PROCALCITON <0.10  LATICACIDVEN 1.3    Recent Results (from the past 240 hour(s))  SARS Coronavirus 2 by RT PCR (hospital order, performed in [facility] hospital lab) Nasopharyngeal Nasopharyngeal Swab     Status: Abnormal   Collection Time: 07/01/2020  3:52 PM   Specimen: Nasopharyngeal Swab  Result Value Ref Range Status   SARS Coronavirus 2 POSITIVE (A) NEGATIVE Final    Comment: RESULT CALLED TO, READ BACK BY AND VERIFIED WITH: Donnetta Hail RN 1720 07/16/2020 A BROWNING (NOTE) SARS-CoV-2 target nucleic acids are DETECTED  SARS-CoV-2 RNA is generally detectable in upper respiratory specimens  during the acute phase of infection.  Positive results are indicative  of the presence of the identified virus,  but do not rule out bacterial infection or co-infection with other pathogens not detected by the test.  Clinical correlation with patient history and  other diagnostic information is necessary to determine patient infection status.  The expected result is negative.  Fact Sheet for Patients:   BoilerBrush.com.cy   Fact Sheet for Healthcare Providers:   https://pope.com/    This test is not yet approved or cleared by the [place] FDA and  has been authorized for  detection and/or diagnosis of SARS-CoV-2 by FDA under an Emergency Use Authorization (EUA).  This EUA will remain in effect (meaning this t est can be used) for the duration of  the COVID-19 declaration under Section 564(b)(1) of the Act, 21 U.S.C. section 360-bbb-3(b)(1), unless the authorization is terminated or revoked sooner.  Performed at [facility], [address], [place], [place] [address]   Blood Culture (routine x 2)     Status: None (Preliminary result)   Collection Time: 07/13/2020  5:34 PM   Specimen: BLOOD  Result Value Ref Range Status   Specimen Description BLOOD LEFT ANTECUBITAL  Final   Special Requests   Final    BOTTLES DRAWN AEROBIC AND ANAEROBIC Blood Culture results may not be optimal due to an inadequate volume of blood received in culture bottles   Culture   Final    NO GROWTH < 24 HOURS Performed at [facility], [address], [place], [place] [address]    Report Status PENDING  Incomplete         Radiology Studies: DG Chest Port 1 View  Result Date: 07/17/2020 CLINICAL DATA:  Short of breath EXAM: PORTABLE CHEST 1 VIEW COMPARISON:  None. FINDINGS: Single frontal view of the chest demonstrates an enlarged cardiac silhouette. There is diffuse increased interstitial prominence, with patchy areas of bilateral airspace disease seen at the lung bases. Trace left effusion is suspected. No pneumothorax. IMPRESSION: 1. Constellation  of findings most suggestive of congestive heart failure. Electronically Signed   By: Sharlet Salina M.D.   On: 07/08/2020 14:55        Scheduled Meds: . vitamin C  500 mg Oral Daily  . dexamethasone (DECADRON) injection  6 mg Intravenous Q24H  . enoxaparin (LOVENOX) injection  40 mg Subcutaneous Q24H  . furosemide  40 mg Intravenous BID  . insulin aspart  0-15 Units Subcutaneous TID WC  . levothyroxine  25 mcg Oral Q0600  . sodium chloride flush  3 mL Intravenous Q12H  . sodium chloride flush  3 mL Intravenous Q12H  . zinc sulfate  220 mg Oral Daily   Continuous Infusions: . sodium chloride    . remdesivir 100 mg in NS 100 mL Stopped (07/13/20 1104)     LOS: 1 day    Time spent: 35 minutes.     Alba Cory, MD [facility]   If 7PM-7AM, please contact night-coverage [email or web address]  07/13/2020, 2:48 PM

## 2020-07-13 NOTE — ED Notes (Signed)
Updated pt's daughter that the 27W RN says pt can't have visitors unless it is end of life care. I explained to the 5W RN that pt is confused and having the daughter is beneficial for pt safety and orientation, but 5W RN says that pt still can't have visitor during stay. Relayed all of this to the pt's daughter and pt's daughter verbalized understanding.

## 2020-07-14 ENCOUNTER — Encounter (HOSPITAL_COMMUNITY): Payer: Self-pay | Admitting: Internal Medicine

## 2020-07-14 DIAGNOSIS — J9601 Acute respiratory failure with hypoxia: Secondary | ICD-10-CM | POA: Diagnosis not present

## 2020-07-14 DIAGNOSIS — I5041 Acute combined systolic (congestive) and diastolic (congestive) heart failure: Secondary | ICD-10-CM | POA: Diagnosis not present

## 2020-07-14 DIAGNOSIS — I429 Cardiomyopathy, unspecified: Secondary | ICD-10-CM | POA: Diagnosis not present

## 2020-07-14 DIAGNOSIS — U071 COVID-19: Principal | ICD-10-CM

## 2020-07-14 LAB — CBC WITH DIFFERENTIAL/PLATELET
Abs Immature Granulocytes: 0.04 10*3/uL (ref 0.00–0.07)
Basophils Absolute: 0 10*3/uL (ref 0.0–0.1)
Basophils Relative: 0 %
Eosinophils Absolute: 0 10*3/uL (ref 0.0–0.5)
Eosinophils Relative: 0 %
HCT: 36.6 % (ref 36.0–46.0)
Hemoglobin: 12.7 g/dL (ref 12.0–15.0)
Immature Granulocytes: 1 %
Lymphocytes Relative: 8 %
Lymphs Abs: 0.4 10*3/uL — ABNORMAL LOW (ref 0.7–4.0)
MCH: 31.8 pg (ref 26.0–34.0)
MCHC: 34.7 g/dL (ref 30.0–36.0)
MCV: 91.5 fL (ref 80.0–100.0)
Monocytes Absolute: 0.3 10*3/uL (ref 0.1–1.0)
Monocytes Relative: 6 %
Neutro Abs: 4.8 10*3/uL (ref 1.7–7.7)
Neutrophils Relative %: 85 %
Platelets: 159 10*3/uL (ref 150–400)
RBC: 4 MIL/uL (ref 3.87–5.11)
RDW: 14.4 % (ref 11.5–15.5)
WBC: 5.6 10*3/uL (ref 4.0–10.5)
nRBC: 0 % (ref 0.0–0.2)

## 2020-07-14 LAB — GLUCOSE, CAPILLARY
Glucose-Capillary: 221 mg/dL — ABNORMAL HIGH (ref 70–99)
Glucose-Capillary: 210 mg/dL — ABNORMAL HIGH (ref 70–99)
Glucose-Capillary: 181 mg/dL — ABNORMAL HIGH (ref 70–99)
Glucose-Capillary: 181 mg/dL — ABNORMAL HIGH (ref 70–99)

## 2020-07-14 LAB — COMPREHENSIVE METABOLIC PANEL
ALT: 31 U/L (ref 0–44)
AST: 56 U/L — ABNORMAL HIGH (ref 15–41)
Albumin: 3.3 g/dL — ABNORMAL LOW (ref 3.5–5.0)
Alkaline Phosphatase: 41 U/L (ref 38–126)
Anion gap: 18 — ABNORMAL HIGH (ref 5–15)
BUN: 22 mg/dL (ref 8–23)
CO2: 25 mmol/L (ref 22–32)
Calcium: 8.2 mg/dL — ABNORMAL LOW (ref 8.9–10.3)
Chloride: 95 mmol/L — ABNORMAL LOW (ref 98–111)
Creatinine, Ser: 1.67 mg/dL — ABNORMAL HIGH (ref 0.44–1.00)
GFR, Estimated: 30 mL/min — ABNORMAL LOW (ref >60)
Glucose, Bld: 251 mg/dL — ABNORMAL HIGH (ref 70–99)
Potassium: 3.9 mmol/L (ref 3.5–5.1)
Sodium: 138 mmol/L (ref 135–145)
Total Bilirubin: 0.9 mg/dL (ref 0.3–1.2)
Total Protein: 7 g/dL (ref 6.5–8.1)

## 2020-07-14 LAB — PHOSPHORUS: Phosphorus: 3.2 mg/dL (ref 2.5–4.6)

## 2020-07-14 LAB — MAGNESIUM: Magnesium: 1.9 mg/dL (ref 1.7–2.4)

## 2020-07-14 LAB — FERRITIN: Ferritin: 540 ng/mL — ABNORMAL HIGH (ref 11–307)

## 2020-07-14 LAB — D-DIMER, QUANTITATIVE: D-Dimer, Quant: 1.92 ug/mL-FEU — ABNORMAL HIGH (ref 0.00–0.50)

## 2020-07-14 LAB — VITAMIN B12: Vitamin B-12: 3827 pg/mL — ABNORMAL HIGH (ref 180–914)

## 2020-07-14 LAB — C-REACTIVE PROTEIN: CRP: 8.7 mg/dL — ABNORMAL HIGH (ref <1.0)

## 2020-07-14 MED ORDER — FUROSEMIDE 10 MG/ML IJ SOLN
40.0000 mg | Freq: Every day | INTRAMUSCULAR | Status: DC
  Filled 2020-07-14: qty 4

## 2020-07-14 MED ORDER — HALOPERIDOL LACTATE 5 MG/ML IJ SOLN
2.5000 mg | Freq: Once | INTRAMUSCULAR | Status: AC
  Administered 2020-07-14: 2.5 mg via INTRAVENOUS
  Filled 2020-07-14: qty 1

## 2020-07-14 MED ORDER — BARICITINIB 1 MG PO TABS
1.0000 mg | ORAL_TABLET | Freq: Every day | ORAL | Status: DC
  Administered 2020-07-14 – 2020-07-18 (×4): 1 mg via ORAL
  Filled 2020-07-14 (×6): qty 1

## 2020-07-14 MED ORDER — ENOXAPARIN SODIUM 30 MG/0.3ML ~~LOC~~ SOLN
30.0000 mg | SUBCUTANEOUS | Status: DC
  Administered 2020-07-14 – 2020-07-19 (×6): 30 mg via SUBCUTANEOUS
  Filled 2020-07-14 (×6): qty 0.3

## 2020-07-14 MED ORDER — DEXAMETHASONE SODIUM PHOSPHATE 10 MG/ML IJ SOLN
10.0000 mg | Freq: Two times a day (BID) | INTRAMUSCULAR | Status: DC
  Administered 2020-07-14 – 2020-07-20 (×12): 10 mg via INTRAVENOUS
  Filled 2020-07-14 (×12): qty 1

## 2020-07-14 NOTE — Progress Notes (Signed)
Patient very agitated and confused while removing O2 mask despite RN reorienting patient. RN attempted to distract patient with television, mitts, PRN meds, and consistent rounding with no success. MD notified - see new orders.

## 2020-07-14 NOTE — Consult Note (Signed)
Cardiology Consultation:   Due to the COVID-19 pandemic, this visit was completed with telemedicine (audio/video) technology to reduce patient and provider exposure as well as to preserve personal protective equipment.   Patient ID: Teja Costen MRN: [id number]; DOB: Jan 12, 1934  Admit date: 08-10-2020 Date of Consult: 07/14/2020  Primary Care Provider: Patient, No Pcp Per Primary Cardiologist: No primary care provider on file.  Primary Electrophysiologist:  None    Patient Profile:   Miryah Ralls is a 87 y.o. female without prior cardiac history who is being seen today for the evaluation of heart failure at the request of Dr. Sunnie Nielsen.  History of Present Illness:   Ms. Bose is unable to give me history. I contacted her daughter for collateral history. She states that the patient has no prior cardiac history, has never been told that she has any issues with her heart. Was on no medications. The patient's sister has a history of heart failure, with a hospitalization last year, but no other significant family history that she is aware of.  History agrees with chart, presented to PCP for shortness of breath, found to be hypoxic and send to ER via EMS. Is covid positive. Echo as below with reduced EF.  Past Medical History:  Diagnosis Date  . Thyroid disease     History reviewed. No pertinent surgical history.   Home Medications:  Prior to Admission medications   Medication Sig Start Date End Date Taking? Authorizing Provider  Multiple Vitamin (MULTIVITAMIN ADULT) TABS Take 1 tablet by mouth daily.   Yes [provider]    Inpatient Medications: Scheduled Meds: . vitamin C  500 mg Oral Daily  . baricitinib  1 mg Oral Daily  . dexamethasone (DECADRON) injection  10 mg Intravenous Q12H  . enoxaparin (LOVENOX) injection  30 mg Subcutaneous Q24H  . furosemide  40 mg Intravenous BID  . insulin aspart  0-15 Units Subcutaneous TID WC  . levothyroxine  25 mcg Oral Q0600   . sodium chloride flush  3 mL Intravenous Q12H  . sodium chloride flush  3 mL Intravenous Q12H  . zinc sulfate  220 mg Oral Daily   Continuous Infusions: . sodium chloride    . remdesivir 100 mg in NS 100 mL 100 mg (07/14/20 1006)   PRN Meds: sodium chloride, chlorpheniramine-HYDROcodone, guaiFENesin-dextromethorphan, haloperidol, polyethylene glycol, sodium chloride flush  Allergies:   No Known Allergies  Social History:   Social History   Socioeconomic History  . Marital status: Single    Spouse name: Not on file  . Number of children: Not on file  . Years of education: Not on file  . Highest education level: Not on file  Occupational History  . Not on file  Tobacco Use  . Smoking status: Not on file  . Smokeless tobacco: Not on file  Substance and Sexual Activity  . Alcohol use: Not on file  . Drug use: Not on file  . Sexual activity: Not on file  Other Topics Concern  . Not on file  Social History Narrative  . Not on file   Social Determinants of Health   Financial Resource Strain: Not on file  Food Insecurity: Not on file  Transportation Needs: Not on file  Physical Activity: Not on file  Stress: Not on file  Social Connections: Not on file  Intimate Partner Violence: Not on file    Family History:    Family History  Problem Relation Age of Onset  . Lung disease Father  ROS:  Unable to give ROS due to poor historian/mental status    Physical Exam/Data:   Vitals:   07/14/20 0814 07/14/20 1000 07/14/20 1154 07/14/20 1600  BP: (!) 165/96  134/83   Pulse: 85  77 77  Resp: 20  16   Temp:   98.1 F (36.7 C)   TempSrc:   Axillary   SpO2: 96%  (!) 87% 96%  Weight:  75.1 kg    Height:  5' (1.524 m)      Intake/Output Summary (Last 24 hours) at 07/14/2020 1651 Last data filed at 07/14/2020 1600 Gross per 24 hour  Intake 100 ml  Output 700 ml  Net -600 ml   Last 3 Weights 07/14/2020  Weight (lbs) 165 lb 8 oz  Weight (kg) 75.07 kg     Body  mass index is 32.32 kg/m.   VITAL SIGNS:  reviewed  EKG:  The EKG was personally reviewed and demonstrates:  NSR Telemetry:  Telemetry was personally reviewed and demonstrates:  NSR  Relevant CV Studies: Echo 07/13/20 1. Left ventricular ejection fraction, by estimation, is 30 to 35%. The  left ventricle has moderately decreased function. The left ventricle  demonstrates global hypokinesis. There is mild left ventricular  hypertrophy. Left ventricular diastolic  parameters are consistent with Grade I diastolic dysfunction (impaired  relaxation).  2. Right ventricular systolic function is moderately reduced. The right  ventricular size is normal. There is normal pulmonary artery systolic  pressure. The estimated right ventricular systolic pressure is 23.8 mmHg.  3. The aortic valve is tricuspid. Aortic valve regurgitation is not  visualized. Mild aortic valve sclerosis is present, with no evidence of  aortic valve stenosis.  4. No evidence of mitral valve regurgitation. No evidence of mitral  stenosis.  5. The inferior vena cava is normal in size with greater than 50%  respiratory variability, suggesting right atrial pressure of 3 mmHg.  6. A small pericardial effusion is present.   Laboratory Data:  Chemistry Recent Labs  Lab 06/30/2020 1552 06/18/2020 1601 07/13/20 0451 07/14/20 0253  NA 137 138 138 138  K 4.7 3.8 4.7 3.9  CL 100  --  102 95*  CO2 27  --  24 25  GLUCOSE 188*  --  191* 251*  BUN 13  --  15 22  CREATININE 1.22*  --  1.43* 1.67*  CALCIUM 8.5*  --  8.2* 8.2*  GFRNONAA 44*  --  36* 30*  ANIONGAP 10  --  12 18*    Recent Labs  Lab 07/17/2020 1552 07/13/20 0451 07/14/20 0253  PROT 6.6 6.5 7.0  ALBUMIN 3.3* 3.2* 3.3*  AST 64* 57* 56*  ALT 32 31 31  ALKPHOS 41 31* 41  BILITOT 1.2 1.3* 0.9   Hematology Recent Labs  Lab 06/17/2020 1552 07/13/2020 1601 07/13/20 1858 07/14/20 0253  WBC 2.7*  --  4.0 5.6  RBC 4.13  --  3.98 4.00  HGB 12.7 12.6 12.2  12.7  HCT 39.2 37.0 37.3 36.6  MCV 94.9  --  93.7 91.5  MCH 30.8  --  30.7 31.8  MCHC 32.4  --  32.7 34.7  RDW 14.7  --  14.4 14.4  PLT 96*  --  152 159   Cardiac EnzymesNo results for input(s): TROPONINI in the last 168 hours. No results for input(s): TROPIPOC in the last 168 hours.  BNP Recent Labs  Lab 07/17/2020 1552  BNP 134.8*    DDimer  Recent Labs  Lab  07/13/20 0451 07/14/20 0253  DDIMER 1.69* 1.92*    Radiology/Studies:  DG Chest Port 1 View  Result Date: 07/16/2020 CLINICAL DATA:  Short of breath EXAM: PORTABLE CHEST 1 VIEW COMPARISON:  None. FINDINGS: Single frontal view of the chest demonstrates an enlarged cardiac silhouette. There is diffuse increased interstitial prominence, with patchy areas of bilateral airspace disease seen at the lung bases. Trace left effusion is suspected. No pneumothorax. IMPRESSION: 1. Constellation of findings most suggestive of congestive heart failure. Electronically Signed   By: Sharlet Salina M.D.   On: 06/29/2020 14:55   ECHOCARDIOGRAM LIMITED  Result Date: 07/13/2020    ECHOCARDIOGRAM LIMITED REPORT   Patient Name:   KATNISS WEEDMAN Date of Exam: 07/13/2020 Medical Rec #:  [id number]      Height: Accession #:    [id number]     Weight: Date of Birth:  Apr 03, 1934      BSA: Patient Age:    86 years       BP:           127/78 mmHg Patient Gender: F              HR:           70 bpm. Exam Location:  Inpatient Procedure: Limited Echo, Cardiac Doppler and Color Doppler Indications:    Dyspnea  History:        Patient has no prior history of Echocardiogram examinations.                 Signs/Symptoms:Shortness of Breath; Risk Factors:Diabetes.                 COVID+, Resp. distress.  Sonographer:    Lavenia Atlas Referring Phys: [id number] Cecille Po MELVIN  Sonographer Comments: Technically challenging study due to limited acoustic windows. COVID+ IMPRESSIONS  1. Left ventricular ejection fraction, by estimation, is 30 to 35%. The left ventricle has  moderately decreased function. The left ventricle demonstrates global hypokinesis. There is mild left ventricular hypertrophy. Left ventricular diastolic parameters are consistent with Grade I diastolic dysfunction (impaired relaxation).  2. Right ventricular systolic function is moderately reduced. The right ventricular size is normal. There is normal pulmonary artery systolic pressure. The estimated right ventricular systolic pressure is 23.8 mmHg.  3. The aortic valve is tricuspid. Aortic valve regurgitation is not visualized. Mild aortic valve sclerosis is present, with no evidence of aortic valve stenosis.  4. No evidence of mitral valve regurgitation. No evidence of mitral stenosis.  5. The inferior vena cava is normal in size with greater than 50% respiratory variability, suggesting right atrial pressure of 3 mmHg.  6. A small pericardial effusion is present. FINDINGS  Left Ventricle: Left ventricular ejection fraction, by estimation, is 30 to 35%. The left ventricle has moderately decreased function. The left ventricle demonstrates global hypokinesis. The left ventricular internal cavity size was normal in size. There is mild left ventricular hypertrophy. Left ventricular diastolic parameters are consistent with Grade I diastolic dysfunction (impaired relaxation). Right Ventricle: The right ventricular size is normal. No increase in right ventricular wall thickness. Right ventricular systolic function is moderately reduced. There is normal pulmonary artery systolic pressure. The tricuspid regurgitant velocity is 2.28 m/s, and with an assumed right atrial pressure of 3 mmHg, the estimated right ventricular systolic pressure is 23.8 mmHg. Pericardium: A small pericardial effusion is present. Mitral Valve: There is mild calcification of the mitral valve leaflet(s). Mild mitral annular calcification. No evidence of mitral valve stenosis. Tricuspid Valve: The  tricuspid valve is normal in structure. Tricuspid  valve regurgitation is trivial. Aortic Valve: The aortic valve is tricuspid. Aortic valve regurgitation is not visualized. Mild aortic valve sclerosis is present, with no evidence of aortic valve stenosis. Aortic valve peak gradient measures 3.3 mmHg. Aorta: The aortic root is normal in size and structure. Venous: The inferior vena cava is normal in size with greater than 50% respiratory variability, suggesting right atrial pressure of 3 mmHg. LEFT VENTRICLE PLAX 2D LVIDd:         4.00 cm      Diastology LVIDs:         2.80 cm      LV e' medial:    4.35 cm/s LV PW:         1.30 cm      LV E/e' medial:  10.7 LV IVS:        1.30 cm      LV e' lateral:   4.68 cm/s LVOT diam:     2.00 cm      LV E/e' lateral: 10.0 LVOT Area:     3.14 cm  LV Volumes (MOD) LV vol d, MOD A4C: 111.0 ml LV vol s, MOD A4C: 47.4 ml LV SV MOD A4C:     111.0 ml RIGHT VENTRICLE RV S prime:     5.11 cm/s LEFT ATRIUM LA diam:    2.50 cm  AORTIC VALVE AV Vmax:      90.80 cm/s AV Peak Grad: 3.3 mmHg  AORTA Ao Root diam: 2.80 cm MITRAL VALVE               TRICUSPID VALVE MV Area (PHT): 3.85 cm    TR Peak grad:   20.8 mmHg MV Decel Time: 197 msec    TR Vmax:        228.00 cm/s MV E velocity: 46.70 cm/s MV A velocity: 70.40 cm/s  SHUNTS MV E/A ratio:  0.66        Systemic Diam: 2.00 cm Marca Ancona MD Electronically signed by Marca Ancona MD Signature Date/Time: 07/13/2020/10:47:24 PM    Final     Assessment and Plan:   Covid 19 infection New cardiomyopathy Acute systolic and diastolic heart failure -daughter denies any prior history of heart issues. -echo with EF 30-35%.  -small effusion without tamponade -echo shows RA pressure of 3 mmHg. Would be cautious about diuresis. May benefit instead from gentle afterload reduction. Blood pressures have been labile. If her BP remains stable to elevated, would start low dose hydralazine/isordil -Cr rising, may be due to intravascular volume depletion (given low RA pressure). Will change to daily  lasix dosing from BID -defer ischemic evaluation at this time given Covid infection -Covid treatment per primary team  For questions or updates, please contact [facility] Please consult [email or web address] for contact info under   Signed, Jodelle Red, MD  07/14/2020 4:51 PM

## 2020-07-14 NOTE — Evaluation (Signed)
Occupational Therapy Evaluation Patient Details Name: Kaitlyn Novak MRN: [id number] DOB: 1934-02-16 Today's Date: 07/14/2020    History of Present Illness 87 y.o. female with unclear past medical history (?thyroid disease) who presented to ED from [facility] due to respiratory distress and hypoxia. dx:  Covid+   Clinical Impression   Pt admitted with above. She demonstrates the below listed deficits and will benefit from continued OT to maximize safety and independence with BADLs.  Pt presents to OT with generalized weakness, decreased activity tolerance, impaired balance, and impaired cognition.  She currently requires mod - max A for ADLs, overall, and mod A for functional transfers.  Sp02 in low 90s at rest with pt on 15L HFNC, but decreases to mid 80s with 15L and NRB during activity.  PT spoke with pt's daughter, with whom pt was living.  Pt required some assist for ADLs PTA, and was ambulating household distances.  Recommend pt return home with HHOT and HHaide.  Will follow.       Follow Up Recommendations  Home health OT;Supervision/Assistance - 24 hour;Other (comment) (HHaide)    Equipment Recommendations  None recommended by OT    Recommendations for Other Services       Precautions / Restrictions Precautions Precautions: Fall;Other (comment) Precaution Comments: impaired cognition      Mobility Bed Mobility Overal bed mobility: Needs Assistance Bed Mobility: Supine to Sit     Supine to sit: HOB elevated;Min assist     General bed mobility comments: +rail, increased time, assist to elevate trunk, use of bed pad to scoot to EOB    Transfers Overall transfer level: Needs assistance Equipment used: 1 person hand held assist Transfers: Sit to/from [employer] Sit to Stand: Mod assist;From elevated surface Stand pivot transfers: Mod assist       General transfer comment: cues for sequencing, assist to power up and stabilize balance, pivot  transfer toward right bed to recliner, assist to control descent    Balance Overall balance assessment: Needs assistance Sitting-balance support: Feet supported;Single extremity supported Sitting balance-Leahy Scale: Fair     Standing balance support: Bilateral upper extremity supported;During functional activity Standing balance-Leahy Scale: Poor Standing balance comment: reliant on external support                           ADL either performed or assessed with clinical judgement   ADL Overall ADL's : Needs assistance/impaired Eating/Feeding: Set up;Sitting   Grooming: Wash/dry hands;Wash/dry face;Oral care;Sitting;Minimal assistance   Upper Body Bathing: Moderate assistance;Sitting   Lower Body Bathing: Maximal assistance;Sit to/from stand   Upper Body Dressing : Moderate assistance;Sitting   Lower Body Dressing: Maximal assistance;Sit to/from stand   Toilet Transfer: Moderate assistance;Stand-pivot;BSC   Toileting- Clothing Manipulation and Hygiene: Maximal assistance;Sit to/from stand       Functional mobility during ADLs: Moderate assistance       Vision Patient Visual Report: No change from baseline       Perception     Praxis      Pertinent Vitals/Pain Pain Assessment: Faces Faces Pain Scale: No hurt     Hand Dominance Right (ambidextrous)   Extremity/Trunk Assessment Upper Extremity Assessment Upper Extremity Assessment: Generalized weakness   Lower Extremity Assessment Lower Extremity Assessment: Generalized weakness   Cervical / Trunk Assessment Cervical / Trunk Assessment: Kyphotic   Communication Communication Communication: HOH   Cognition Arousal/Alertness: Awake/alert Behavior During Therapy: WFL for tasks assessed/performed Overall Cognitive Status: No  family/caregiver present to determine baseline cognitive functioning                                 General Comments: h/o cognitive impairment. Difficulty  staying on task. Easily distracted. Follows commands inconsistently. Tangential.   General Comments  15L HFNC at rest in bed SpO2 90%. Desat to 84% with sitting EOB. Added 15L NRB and improved to 96%. Transferred to recliner 15L + 15L NRB with SpO2 95%. Removed NRB in recliner and desat to 80%. NRB reapplied and O2 returned to 91%.    Exercises     Shoulder Instructions      Home Living Family/patient expects to be discharged to:: Private residence Living Arrangements: Children Available Help at Discharge: Family;Available 24 hours/day Type of Home: House Home Access: Level entry     Home Layout: One level     Bathroom Shower/Tub: Chief Strategy Officer: Standard     Home Equipment: Environmental consultant - 2 wheels;Toilet riser;Shower seat;Grab bars - tub/shower   Additional Comments: lives with her daughter      Prior Functioning/Environment Level of Independence: Needs assistance  Gait / Transfers Assistance Needed: independent mobility household distances ADL's / Homemaking Assistance Needed: daughter assisted with ADLs as needed, primarily bathing and iADLs            OT Problem List: Decreased strength;Decreased activity tolerance;Impaired balance (sitting and/or standing);Decreased cognition;Decreased safety awareness;Decreased knowledge of use of DME or AE;Cardiopulmonary status limiting activity      OT Treatment/Interventions: Self-care/ADL training;Therapeutic exercise;DME and/or AE instruction;Energy conservation;Therapeutic activities;Cognitive remediation/compensation;Patient/family education;Balance training    OT Goals(Current goals can be found in the care plan section) Acute Rehab OT Goals Patient Stated Goal: home per daughter OT Goal Formulation: With patient Time For Goal Achievement: 07/28/20 Potential to Achieve Goals: Good ADL Goals Pt Will Perform Grooming: (P) with supervision;sitting Pt Will Perform Upper Body Bathing: (P) with set-up;with  supervision;sitting Pt Will Perform Lower Body Bathing: (P) with min assist;sit to/from stand Pt Will Perform Upper Body Dressing: (P) with set-up;with supervision;sitting Pt Will Perform Lower Body Dressing: (P) with min assist;sit to/from stand Pt Will Transfer to Toilet: (P) with min assist;stand pivot transfer;bedside commode Pt Will Perform Toileting - Clothing Manipulation and hygiene: (P) with min assist;sit to/from stand  OT Frequency: Min 2X/week   Barriers to D/C:            Co-evaluation              AM-PAC OT "6 Clicks" Daily Activity     Outcome Measure Help from another person eating meals?: A Little Help from another person taking care of personal grooming?: A Little Help from another person toileting, which includes using toliet, bedpan, or urinal?: A Lot Help from another person bathing (including washing, rinsing, drying)?: A Lot Help from another person to put on and taking off regular upper body clothing?: A Lot Help from another person to put on and taking off regular lower body clothing?: A Lot 6 Click Score: 14   End of Session Equipment Utilized During Treatment: Oxygen Nurse Communication: Mobility status  Activity Tolerance: Patient limited by fatigue Patient left: in chair;with call bell/phone within reach;with chair alarm set  OT Visit Diagnosis: Unsteadiness on feet (R26.81);Cognitive communication deficit (R41.841)                Time: 1000-1025 OT Time Calculation (min): 25 min Charges:  OT General  Charges $OT Visit: 1 Visit OT Evaluation $OT Eval Moderate Complexity: 1 Mod OT Treatments $Therapeutic Activity: 8-22 mins  Eber Jones., OTR/L Acute Rehabilitation Services Pager [phone] Office [phone]   Jeani Hawking M 07/14/2020, 12:04 PM

## 2020-07-14 NOTE — Evaluation (Signed)
Physical Therapy Evaluation Patient Details Name: Kaitlyn Novak MRN: [id number] DOB: Nov 12, 1933 Today's Date: 07/14/2020   History of Present Illness  87 y.o. female with unclear past medical history (?thyroid disease) who presented to ED from [place] primary care due to respiratory distress and hypoxia. Patient moved in with her daughter from [place] over the summer.  Patient reportedly saw a doctor in [place] but is unable to list any medical conditions or medicine she takes.  Per her daughter pt only takes a daily vitamin at this time. Pt admitted for acute respiratory failure with hypoxia. Covid+    Clinical Impression  Pt admitted with above diagnosis. PTA pt independent mobility household distances. Daughter assisted with ADLs, as needed. On eval, pt required min assist bed mobility, mod assist sit to stand, and mod assist SPT. SpO2 stable during activity on 15L HFNC + 15L NRB. 95% in recliner, therefore, NRB removed. Subsequent desat to 80% requiring replacement of NRB. SpO2 91% in recliner on 15L HFNC + 15L NRB. Pt currently with functional limitations due to the deficits listed below. Pt will benefit from skilled PT to increase their independence and safety with mobility to allow discharge to the venue listed below. Spoke with pt's daughter over the phone. Daughter's desire is for pt to d/c home. She confirms she is able to provide 24-hour assist. If daughter determines she is unable to provide needed level of assist, SNF will need to be considered.     Follow Up Recommendations Home health PT;Supervision/Assistance - 24 hour    Equipment Recommendations  Wheelchair cushion (measurements PT);Wheelchair (measurements PT);3in1 (PT);Other (comment) (home O2)    Recommendations for Other Services       Precautions / Restrictions Precautions Precautions: Fall;Other (comment) Precaution Comments: impaired cognition      Mobility  Bed Mobility Overal bed mobility: Needs Assistance Bed Mobility:  Supine to Sit     Supine to sit: HOB elevated;Min assist     General bed mobility comments: +rail, increased time, assist to elevate trunk, use of bed pad to scoot to EOB    Transfers Overall transfer level: Needs assistance Equipment used: 1 person hand held assist Transfers: Sit to/from [employer] Sit to Stand: Mod assist;From elevated surface Stand pivot transfers: Mod assist       General transfer comment: cues for sequencing, assist to power up and stabilize balance, pivot transfer toward right bed to recliner, assist to control descent  Ambulation/Gait             General Gait Details: unable due to weakness and high O2 needs/poor reserve  Stairs            Wheelchair Mobility    Modified Rankin (Stroke Patients Only)       Balance Overall balance assessment: Needs assistance Sitting-balance support: Feet supported;Single extremity supported Sitting balance-Leahy Scale: Fair     Standing balance support: Bilateral upper extremity supported;During functional activity Standing balance-Leahy Scale: Poor Standing balance comment: reliant on external support                             Pertinent Vitals/Pain Pain Assessment: Faces Faces Pain Scale: No hurt    Home Living Family/patient expects to be discharged to:: Private residence Living Arrangements: Children Available Help at Discharge: Family;Available 24 hours/day Type of Home: House Home Access: Level entry     Home Layout: One level Home Equipment: Walker - 2 wheels;Toilet riser;Shower seat;Grab bars -  tub/shower Additional Comments: lives with her daughter    Prior Function Level of Independence: Needs assistance   Gait / Transfers Assistance Needed: independent mobility household distances  ADL's / Engineer, water Assistance Needed: daughter assisted with ADLs as needed, primarily bathing and iADLs        Hand Dominance   Dominant Hand: Right  (ambidextrous)    Extremity/Trunk Assessment   Upper Extremity Assessment Upper Extremity Assessment: Generalized weakness    Lower Extremity Assessment Lower Extremity Assessment: Generalized weakness    Cervical / Trunk Assessment Cervical / Trunk Assessment: Kyphotic  Communication   Communication: HOH  Cognition Arousal/Alertness: Awake/alert Behavior During Therapy: WFL for tasks assessed/performed Overall Cognitive Status: No family/caregiver present to determine baseline cognitive functioning                                 General Comments: h/o cognitive impairment. Difficulty staying on task. Easily distracted. Follows commands inconsistently. Tangential.      General Comments General comments (skin integrity, edema, etc.): 15L HFNC at rest in bed SpO2 90%. Desat to 84% with sitting EOB. Added 15L NRB and improved to 96%. Transferred to recliner 15L + 15L NRB with SpO2 95%. Removed NRB in recliner and desat to 80%. NRB reapplied and O2 returned to 91%.    Exercises     Assessment/Plan    PT Assessment Patient needs continued PT services  PT Problem List Decreased strength;Decreased mobility;Decreased safety awareness;Decreased activity tolerance;Cardiopulmonary status limiting activity;Decreased balance;Decreased knowledge of use of DME       PT Treatment Interventions DME instruction;Therapeutic activities;Gait training;Therapeutic exercise;Patient/family education;Balance training;Functional mobility training    PT Goals (Current goals can be found in the Care Plan section)  Acute Rehab PT Goals Patient Stated Goal: home per daughter PT Goal Formulation: With patient/family Time For Goal Achievement: 07/28/20 Potential to Achieve Goals: Good    Frequency Min 3X/week   Barriers to discharge        Co-evaluation               AM-PAC PT "6 Clicks" Mobility  Outcome Measure Help needed turning from your back to your side while in a  flat bed without using bedrails?: A Little Help needed moving from lying on your back to sitting on the side of a flat bed without using bedrails?: A Little Help needed moving to and from a bed to a chair (including a wheelchair)?: A Lot Help needed standing up from a chair using your arms (e.g., wheelchair or bedside chair)?: A Lot Help needed to walk in hospital room?: A Lot Help needed climbing 3-5 steps with a railing? : Total 6 Click Score: 13    End of Session Equipment Utilized During Treatment: Gait belt;Oxygen Activity Tolerance: Treatment limited secondary to medical complications (Comment) (high O2 needs/poor reserve) Patient left: in chair;with call bell/phone within reach;with chair alarm set Nurse Communication: Mobility status PT Visit Diagnosis: Muscle weakness (generalized) (M62.81);Difficulty in walking, not elsewhere classified (R26.2)    Time: 6045-4098 PT Time Calculation (min) (ACUTE ONLY): 32 min   Charges:   PT Evaluation $PT Eval Moderate Complexity: 1 Mod PT Treatments $Therapeutic Activity: 8-22 mins        [name], PT  Office # [phone] Pager [phone]   [name] 07/14/2020, 11:43 AM

## 2020-07-14 NOTE — Progress Notes (Signed)
PROGRESS NOTE    Kaitlyn Novak  NFA:[id number] DOB: [birth year] DOA: 08/02/2020 PCP: Patient, No Pcp Per   Brief Narrative: 87 year old unclear past medical history, thyroid disease, presents from [place] primary care due to respiratory distress and hypoxia.  Patient recently moved in with her daughter from [place] over the summer.  Patient currently only taking daily vitamins at this time.  She went to [facility] physician for evaluation of a slowly worsening shortness of breath over the last month  and establish care.  Patient was found to be hypoxic oxygen saturation in the 70s, increased work of breathing.  EMS was contacted, patient was a started on 15 L nonrebreather.  Still having worsening memory problems.  Evaluation in the ED: Patient oxygen saturation 90 on 6 L, creatinine 1.2, white blood cell 2.7, platelet 96, troponin 51.  Covid PCR positive.  Procalcitonin 0.1    Assessment & Plan:   Principal Problem:   Acute respiratory failure with hypoxia (HCC) Active Problems:   Volume overload   COVID-19 virus infection   Troponin level elevated   Creatinine elevation   1-Acute hypoxic respiratory failure, COVID-19 pneumonia, pulmonary edema volume overload -Continue with IV Lasix.  -Continue with remdesivir. -Continue with IV Decadron -Continue with vitamin C and zinc inhaler -Continue to follow inflammatory markers -discussed with daughter, patient requiring more oxygen today now on HFoxygen. Plan to start Baricitinib. Daughter agrees. Risk benefit discussed with daughter.  COVID-19 Labs  Recent Labs    2020/08/02 1552 07/13/20 0451 07/14/20 0253  DDIMER  --  1.69* 1.92*  FERRITIN 521* 578* 540*  LDH 600*  --   --   CRP 6.6* 7.9* 8.7*    Lab Results  Component Value Date   SARSCOV2NAA POSITIVE (A) Aug 02, 2020   2-Volume overload, lower extremity edema, pulmonary edema: Likely secondary  to heart failure Echo showed decreased Ef 35 %, wall motion abnormalities.   Cardiology consulted.   3-Leukopenia; secondary to covid.   4-Mild elevation troponin. Chest pain free.  Related to covid vs heart failure.   5-Hypothyroidism;  TSH at 6. Started  synthroid.   6-Diabetes type 2;  HbA1c at 8.6 Start SSI.   AKI;  Monitor on lasix.   Memory deficit;  Start synthroid.  B12 elevated, not low.    Estimated body mass index is 32.32 kg/m as calculated from the following:   Height as of this encounter: 5' (1.524 m).   Weight as of this encounter: 75.1 kg.   DVT prophylaxis: Lovenox.  Code Status: Full code Family Communication: Daughter over the phone  Disposition Plan:  Status is: Inpatient  Remains inpatient appropriate because:IV treatments appropriate due to intensity of illness or inability to take PO   Dispo: The patient is from: Home              Anticipated d/c is to: Home              Anticipated d/c date is: 3 days              Patient currently is not medically stable to d/c.   Difficult to place patient No        Consultants:   None  Procedures:   ECHO pending.   Antimicrobials:    Subjective: Patient denies dyspnea. She is requiring more oxygen.   Objective: Vitals:   07/14/20 0214 07/14/20 0426 07/14/20 0814 07/14/20 1000  BP:  (!) 150/92 (!) 165/96   Pulse:  74 85   Resp:  19 13 20    Temp:  98.2 F (36.8 C)    TempSrc:  Oral    SpO2: 93% 93% 96%   Weight:    75.1 kg  Height:    5' (1.524 m)    Intake/Output Summary (Last 24 hours) at 07/14/2020 1109 Last data filed at 07/14/2020 0400 Gross per 24 hour  Intake 97.45 ml  Output 700 ml  Net -602.55 ml   Filed Weights   07/14/20 1000  Weight: 75.1 kg    Examination:  General exam: NAD Respiratory system: B/L crackles.  Cardiovascular system: S 1, S 2 RRR Gastrointestinal system: BS present, soft, nt Central nervous system: alert Extremities: trace edema    Data Reviewed: I have personally reviewed following labs and imaging  studies  CBC: Recent Labs  Lab 06/20/2020 1552 07/11/2020 1601 07/13/20 1858 07/14/20 0253  WBC 2.7*  --  4.0 5.6  NEUTROABS 2.0  --  3.2 4.8  HGB 12.7 12.6 12.2 12.7  HCT 39.2 37.0 37.3 36.6  MCV 94.9  --  93.7 91.5  PLT 96*  --  152 159   Basic Metabolic Panel: Recent Labs  Lab 07/14/2020 1552 06/21/2020 1601 07/13/20 0451 07/14/20 0253  NA 137 138 138 138  K 4.7 3.8 4.7 3.9  CL 100  --  102 95*  CO2 27  --  24 25  GLUCOSE 188*  --  191* 251*  BUN 13  --  15 22  CREATININE 1.22*  --  1.43* 1.67*  CALCIUM 8.5*  --  8.2* 8.2*  MG  --   --  2.0 1.9  PHOS  --   --  3.7 3.2   GFR: Estimated Creatinine Clearance: 22.7 mL/min (A) (by C-G formula based on SCr of 1.67 mg/dL (H)). Liver Function Tests: Recent Labs  Lab 07/06/2020 1552 07/13/20 0451 07/14/20 0253  AST 64* 57* 56*  ALT 32 31 31  ALKPHOS 41 31* 41  BILITOT 1.2 1.3* 0.9  PROT 6.6 6.5 7.0  ALBUMIN 3.3* 3.2* 3.3*   No results for input(s): LIPASE, AMYLASE in the last 168 hours. No results for input(s): AMMONIA in the last 168 hours. Coagulation Profile: No results for input(s): INR, PROTIME in the last 168 hours. Cardiac Enzymes: No results for input(s): CKTOTAL, CKMB, CKMBINDEX, TROPONINI in the last 168 hours. BNP (last 3 results) No results for input(s): PROBNP in the last 8760 hours. HbA1C: Recent Labs    07/13/20 0452  HGBA1C 8.6*   CBG: Recent Labs  Lab 07/13/20 0752 07/13/20 1157 07/13/20 1838 07/13/20 2127 07/14/20 0731  GLUCAP 185* 215* 230* 253* 221*   Lipid Profile: Recent Labs    06/20/2020 1552  TRIG 167*   Thyroid Function Tests: Recent Labs    07/13/20 0453  TSH 6.720*   Anemia Panel: Recent Labs    07/13/20 0451 07/14/20 0253  VITAMINB12  --  3,827*  FERRITIN 578* 540*   Sepsis Labs: Recent Labs  Lab 07/14/2020 1552  PROCALCITON <0.10  LATICACIDVEN 1.3    Recent Results (from the past 240 hour(s))  SARS Coronavirus 2 by RT PCR (hospital order, performed in [facility] hospital lab) Nasopharyngeal Nasopharyngeal Swab     Status: Abnormal   Collection Time: 06/30/2020  3:52 PM   Specimen: Nasopharyngeal Swab  Result Value Ref Range Status   SARS Coronavirus 2 POSITIVE (A) NEGATIVE Final    Comment: RESULT CALLED TO, READ BACK BY AND VERIFIED WITHDonnetta Hail RN 1720 06/27/2020 A  BROWNING (NOTE) SARS-CoV-2 target nucleic acids are DETECTED  SARS-CoV-2 RNA is generally detectable in upper respiratory specimens  during the acute phase of infection.  Positive results are indicative  of the presence of the identified virus, but do not rule out bacterial infection or co-infection with other pathogens not detected by the test.  Clinical correlation with patient history and  other diagnostic information is necessary to determine patient infection status.  The expected result is negative.  Fact Sheet for Patients:   BoilerBrush.com.cy   Fact Sheet for Healthcare Providers:   https://pope.com/    This test is not yet approved or cleared by the [place] FDA and  has been authorized for detection and/or diagnosis of SARS-CoV-2 by FDA under an Emergency Use Authorization (EUA).  This EUA will remain in effect (meaning this t est can be used) for the duration of  the COVID-19 declaration under Section 564(b)(1) of the Act, 21 U.S.C. section 360-bbb-3(b)(1), unless the authorization is terminated or revoked sooner.  Performed at [facility], [address], [place], [place] [address]   Blood Culture (routine x 2)     Status: None (Preliminary result)   Collection Time: 07/16/2020  5:34 PM   Specimen: BLOOD  Result Value Ref Range Status   Specimen Description BLOOD LEFT ANTECUBITAL  Final   Special Requests   Final    BOTTLES DRAWN AEROBIC AND ANAEROBIC Blood Culture results may not be optimal due to an inadequate volume of blood received in culture bottles   Culture   Final    NO GROWTH 2  DAYS Performed at [facility], [address], [place], [place] [address]    Report Status PENDING  Incomplete         Radiology Studies: DG Chest Port 1 View  Result Date: 07/08/2020 CLINICAL DATA:  Short of breath EXAM: PORTABLE CHEST 1 VIEW COMPARISON:  None. FINDINGS: Single frontal view of the chest demonstrates an enlarged cardiac silhouette. There is diffuse increased interstitial prominence, with patchy areas of bilateral airspace disease seen at the lung bases. Trace left effusion is suspected. No pneumothorax. IMPRESSION: 1. Constellation of findings most suggestive of congestive heart failure. Electronically Signed   By: Sharlet Salina M.D.   On: 06/18/2020 14:55   ECHOCARDIOGRAM LIMITED  Result Date: 07/13/2020    ECHOCARDIOGRAM LIMITED REPORT   Patient Name:   Kaitlyn Novak Date of Exam: 07/13/2020 Medical Rec #:  [id number]      Height: Accession #:    [id number]     Weight: Date of Birth:  Jul 25, 1933      BSA: Patient Age:    86 years       BP:           127/78 mmHg Patient Gender: F              HR:           70 bpm. Exam Location:  Inpatient Procedure: Limited Echo, Cardiac Doppler and Color Doppler Indications:    Dyspnea  History:        Patient has no prior history of Echocardiogram examinations.                 Signs/Symptoms:Shortness of Breath; Risk Factors:Diabetes.                 COVID+, Resp. distress.  Sonographer:    Lavenia Atlas Referring Phys: [id number] Cecille Po MELVIN  Sonographer Comments: Technically challenging study due to  limited acoustic windows. COVID+ IMPRESSIONS  1. Left ventricular ejection fraction, by estimation, is 30 to 35%. The left ventricle has moderately decreased function. The left ventricle demonstrates global hypokinesis. There is mild left ventricular hypertrophy. Left ventricular diastolic parameters are consistent with Grade I diastolic dysfunction (impaired relaxation).  2. Right ventricular systolic function is moderately reduced.  The right ventricular size is normal. There is normal pulmonary artery systolic pressure. The estimated right ventricular systolic pressure is 23.8 mmHg.  3. The aortic valve is tricuspid. Aortic valve regurgitation is not visualized. Mild aortic valve sclerosis is present, with no evidence of aortic valve stenosis.  4. No evidence of mitral valve regurgitation. No evidence of mitral stenosis.  5. The inferior vena cava is normal in size with greater than 50% respiratory variability, suggesting right atrial pressure of 3 mmHg.  6. A small pericardial effusion is present. FINDINGS  Left Ventricle: Left ventricular ejection fraction, by estimation, is 30 to 35%. The left ventricle has moderately decreased function. The left ventricle demonstrates global hypokinesis. The left ventricular internal cavity size was normal in size. There is mild left ventricular hypertrophy. Left ventricular diastolic parameters are consistent with Grade I diastolic dysfunction (impaired relaxation). Right Ventricle: The right ventricular size is normal. No increase in right ventricular wall thickness. Right ventricular systolic function is moderately reduced. There is normal pulmonary artery systolic pressure. The tricuspid regurgitant velocity is 2.28 m/s, and with an assumed right atrial pressure of 3 mmHg, the estimated right ventricular systolic pressure is 23.8 mmHg. Pericardium: A small pericardial effusion is present. Mitral Valve: There is mild calcification of the mitral valve leaflet(s). Mild mitral annular calcification. No evidence of mitral valve stenosis. Tricuspid Valve: The tricuspid valve is normal in structure. Tricuspid valve regurgitation is trivial. Aortic Valve: The aortic valve is tricuspid. Aortic valve regurgitation is not visualized. Mild aortic valve sclerosis is present, with no evidence of aortic valve stenosis. Aortic valve peak gradient measures 3.3 mmHg. Aorta: The aortic root is normal in size and  structure. Venous: The inferior vena cava is normal in size with greater than 50% respiratory variability, suggesting right atrial pressure of 3 mmHg. LEFT VENTRICLE PLAX 2D LVIDd:         4.00 cm      Diastology LVIDs:         2.80 cm      LV e' medial:    4.35 cm/s LV PW:         1.30 cm      LV E/e' medial:  10.7 LV IVS:        1.30 cm      LV e' lateral:   4.68 cm/s LVOT diam:     2.00 cm      LV E/e' lateral: 10.0 LVOT Area:     3.14 cm  LV Volumes (MOD) LV vol d, MOD A4C: 111.0 ml LV vol s, MOD A4C: 47.4 ml LV SV MOD A4C:     111.0 ml RIGHT VENTRICLE RV S prime:     5.11 cm/s LEFT ATRIUM LA diam:    2.50 cm  AORTIC VALVE AV Vmax:      90.80 cm/s AV Peak Grad: 3.3 mmHg  AORTA Ao Root diam: 2.80 cm MITRAL VALVE               TRICUSPID VALVE MV Area (PHT): 3.85 cm    TR Peak grad:   20.8 mmHg MV Decel Time: 197 msec    TR Vmax:  228.00 cm/s MV E velocity: 46.70 cm/s MV A velocity: 70.40 cm/s  SHUNTS MV E/A ratio:  0.66        Systemic Diam: 2.00 cm Marca Ancona MD Electronically signed by Marca Ancona MD Signature Date/Time: 07/13/2020/10:47:24 PM    Final         Scheduled Meds: . vitamin C  500 mg Oral Daily  . baricitinib  4 mg Oral Daily  . dexamethasone (DECADRON) injection  10 mg Intravenous Q24H  . enoxaparin (LOVENOX) injection  40 mg Subcutaneous Q24H  . furosemide  40 mg Intravenous BID  . insulin aspart  0-15 Units Subcutaneous TID WC  . levothyroxine  25 mcg Oral Q0600  . sodium chloride flush  3 mL Intravenous Q12H  . sodium chloride flush  3 mL Intravenous Q12H  . zinc sulfate  220 mg Oral Daily   Continuous Infusions: . sodium chloride    . remdesivir 100 mg in NS 100 mL 100 mg (07/14/20 1006)     LOS: 2 days    Time spent: 35 minutes.     Alba Cory, MD [facility]   If 7PM-7AM, please contact night-coverage [email or web address]  07/14/2020, 11:09 AM

## 2020-07-14 NOTE — Progress Notes (Signed)
Patient still agitated and attempting to hit staff. MD notified and new orders placed.

## 2020-07-14 NOTE — Progress Notes (Signed)
RT called to pt's room due to So02 in the 70's. Pt was placed on 15L HFNC and NRB by RN. Pt has been excerting trying to get out of bed.  RT and RN in the room took NRB off once Sp02 increased to upper 90's. [place] 02 weaned down to [facility] with Sp02 round 94-95.  No distress noted. Pt tolerating well.

## 2020-07-14 NOTE — Progress Notes (Addendum)
HOSPITAL MEDICINE OVERNIGHT EVENT NOTE    Nursing reports patient is increasingly confused and agitated, regularly pulling off her oxygen desaturating into the 60's-70's.  Not improved with mittens, frequent redirecting, low dose Haldol given earlier.  Will try higher dose of Haldol (2.5mg ) and observe.  Kaitlyn Elk  MD [facility]   ADDENDUM  Nursing reports that additional dose of haldol unfortunately has not helped in calming the patient.  Patient continues to remove her supplemental oxygen desaturating into the 60's.  She is becoming increasingly agitated with staff whenever they try to put her oxygen back on.  Nursing reports there is no sitter available to assist with redirecting the patient.  We  Unfortunately will have to resort to a short course  Of 2 point soft restraints to allow the patient to continue to receive supplemental oxygen safely.   Continuing to monitor closely.  Kaitlyn Novak

## 2020-07-15 DIAGNOSIS — J9601 Acute respiratory failure with hypoxia: Secondary | ICD-10-CM | POA: Diagnosis not present

## 2020-07-15 LAB — GLUCOSE, CAPILLARY
Glucose-Capillary: 201 mg/dL — ABNORMAL HIGH (ref 70–99)
Glucose-Capillary: 251 mg/dL — ABNORMAL HIGH (ref 70–99)
Glucose-Capillary: 258 mg/dL — ABNORMAL HIGH (ref 70–99)
Glucose-Capillary: 209 mg/dL — ABNORMAL HIGH (ref 70–99)
Glucose-Capillary: 235 mg/dL — ABNORMAL HIGH (ref 70–99)

## 2020-07-15 LAB — COMPREHENSIVE METABOLIC PANEL
ALT: 32 U/L (ref 0–44)
AST: 57 U/L — ABNORMAL HIGH (ref 15–41)
Albumin: 3.5 g/dL (ref 3.5–5.0)
Alkaline Phosphatase: 47 U/L (ref 38–126)
Anion gap: 16 — ABNORMAL HIGH (ref 5–15)
BUN: 26 mg/dL — ABNORMAL HIGH (ref 8–23)
CO2: 28 mmol/L (ref 22–32)
Calcium: 8.2 mg/dL — ABNORMAL LOW (ref 8.9–10.3)
Chloride: 95 mmol/L — ABNORMAL LOW (ref 98–111)
Creatinine, Ser: 1.63 mg/dL — ABNORMAL HIGH (ref 0.44–1.00)
GFR, Estimated: 31 mL/min — ABNORMAL LOW (ref >60)
Glucose, Bld: 237 mg/dL — ABNORMAL HIGH (ref 70–99)
Potassium: 3.9 mmol/L (ref 3.5–5.1)
Sodium: 139 mmol/L (ref 135–145)
Total Bilirubin: 1 mg/dL (ref 0.3–1.2)
Total Protein: 7.3 g/dL (ref 6.5–8.1)

## 2020-07-15 LAB — CBC WITH DIFFERENTIAL/PLATELET
Abs Immature Granulocytes: 0.04 10*3/uL (ref 0.00–0.07)
Basophils Absolute: 0 10*3/uL (ref 0.0–0.1)
Basophils Relative: 0 %
Eosinophils Absolute: 0 10*3/uL (ref 0.0–0.5)
Eosinophils Relative: 0 %
HCT: 42.5 % (ref 36.0–46.0)
Hemoglobin: 14.3 g/dL (ref 12.0–15.0)
Immature Granulocytes: 1 %
Lymphocytes Relative: 6 %
Lymphs Abs: 0.4 10*3/uL — ABNORMAL LOW (ref 0.7–4.0)
MCH: 30.8 pg (ref 26.0–34.0)
MCHC: 33.6 g/dL (ref 30.0–36.0)
MCV: 91.6 fL (ref 80.0–100.0)
Monocytes Absolute: 0.2 10*3/uL (ref 0.1–1.0)
Monocytes Relative: 3 %
Neutro Abs: 5.8 10*3/uL (ref 1.7–7.7)
Neutrophils Relative %: 90 %
Platelets: 213 10*3/uL (ref 150–400)
RBC: 4.64 MIL/uL (ref 3.87–5.11)
RDW: 14.2 % (ref 11.5–15.5)
WBC: 6.5 10*3/uL (ref 4.0–10.5)
nRBC: 0 % (ref 0.0–0.2)

## 2020-07-15 LAB — FERRITIN: Ferritin: 615 ng/mL — ABNORMAL HIGH (ref 11–307)

## 2020-07-15 LAB — MAGNESIUM: Magnesium: 1.9 mg/dL (ref 1.7–2.4)

## 2020-07-15 LAB — C-REACTIVE PROTEIN: CRP: 6.6 mg/dL — ABNORMAL HIGH (ref <1.0)

## 2020-07-15 LAB — PHOSPHORUS: Phosphorus: 3.1 mg/dL (ref 2.5–4.6)

## 2020-07-15 LAB — D-DIMER, QUANTITATIVE: D-Dimer, Quant: 1.92 ug/mL-FEU — ABNORMAL HIGH (ref 0.00–0.50)

## 2020-07-15 MED ORDER — FUROSEMIDE 10 MG/ML IJ SOLN
20.0000 mg | Freq: Every day | INTRAMUSCULAR | Status: DC
  Administered 2020-07-15: 20 mg via INTRAVENOUS
  Filled 2020-07-15: qty 2

## 2020-07-15 MED ORDER — HYDRALAZINE HCL 25 MG PO TABS
25.0000 mg | ORAL_TABLET | Freq: Three times a day (TID) | ORAL | Status: DC
Start: 2020-07-15 — End: 2020-07-17
  Filled 2020-07-15 (×3): qty 1

## 2020-07-15 MED ORDER — LIP MEDEX EX OINT
TOPICAL_OINTMENT | CUTANEOUS | Status: DC | PRN
  Filled 2020-07-15: qty 7

## 2020-07-15 NOTE — Plan of Care (Signed)
  Problem: Education: Goal: Knowledge of risk factors and measures for prevention of condition will improve Outcome: Progressing   Problem: Coping: Goal: Psychosocial and spiritual needs will be supported Outcome: Progressing   Problem: Respiratory: Goal: Will maintain a patent airway Outcome: Progressing Goal: Complications related to the disease process, condition or treatment will be avoided or minimized Outcome: Progressing   Problem: Safety: Goal: Non-violent Restraint(s) Outcome: Progressing   Problem: Safety: Goal: Non-violent Restraint(s) Outcome: Progressing   

## 2020-07-15 NOTE — Progress Notes (Signed)
PROGRESS NOTE    Kaitlyn Novak  QBH:[id number] DOB: 20-Jan-1934 DOA: 06/25/2020 PCP: Patient, No Pcp Per   Brief Narrative: 87 year old unclear past medical history, thyroid disease, presents from [place] primary care due to respiratory distress and hypoxia.  Patient recently moved in with her daughter from [place] over the summer.  Patient currently only taking daily vitamins at this time.  She went to [facility] physician for evaluation of a slowly worsening shortness of breath over the last month  and establish care.  Patient was found to be hypoxic oxygen saturation in the 70s, increased work of breathing.  EMS was contacted, patient was a started on 15 L nonrebreather.  Still having worsening memory problems.  Evaluation in the ED: Patient oxygen saturation 90 on 6 L, creatinine 1.2, white blood cell 2.7, platelet 96, troponin 51.  Covid PCR positive.  Procalcitonin 0.1    Assessment & Plan:   Principal Problem:   Acute respiratory failure with hypoxia (HCC) Active Problems:   Volume overload   COVID-19 virus infection   Troponin level elevated   Creatinine elevation   1-Acute hypoxic respiratory failure, COVID-19 pneumonia, pulmonary edema volume overload -Continue with IV Lasix. Reduce dose to avoid dehydration.  -Continue with Remdesivir day 3 -Continue with IV Decadron -Continue with vitamin C and zinc inhaler -Continue to follow inflammatory markers -Started Baricitinib 1/28. -Discussed with daughter, patient is DNR/DNI.  -Patient currently requiring HF andNBM  COVID-19 Labs  Recent Labs    06/22/2020 1552 07/13/20 0451 07/14/20 0253 07/15/20 0721  DDIMER  --  1.69* 1.92* 1.92*  FERRITIN 521* 578* 540*  --   LDH 600*  --   --   --   CRP 6.6* 7.9* 8.7*  --     Lab Results  Component Value Date   SARSCOV2NAA POSITIVE (A) 07/05/2020   2-Volume overload, lower extremity edema, pulmonary edema: Likely secondary  to heart failure Echo showed decreased Ef 35 %, wall  motion abnormalities.  Cardiology consulted.  Appreciate cardiology.  Lasix change to 20 mg IV daily.   3-Leukopenia; secondary to covid.   4-Mild elevation troponin. Chest pain free.  Related to covid vs heart failure.   5-Hypothyroidism;  TSH at 6. Started  synthroid.   6-Diabetes type 2;  HbA1c at 8.6 Start SSI.   AKI;  Monitor on lasix.   Memory deficit;  Start synthroid.  B12 elevated, not low.    Estimated body mass index is 32.85 kg/m as calculated from the following:   Height as of this encounter: 5' (1.524 m).   Weight as of this encounter: 76.3 kg.   DVT prophylaxis: Lovenox.  Code Status: Full code Family Communication: Daughter over the phone 1/29 Disposition Plan:  Status is: Inpatient  Remains inpatient appropriate because:IV treatments appropriate due to intensity of illness or inability to take PO   Dispo: The patient is from: Home              Anticipated d/c is to: Home              Anticipated d/c date is: 3 days              Patient currently is not medically stable to d/c.   Difficult to place patient No        Consultants:   None  Procedures:   ECHO pending.   Antimicrobials:    Subjective: Patient alert, confuse. Soft, restrain.  Does not appears in distress  Objective: Vitals:  07/15/20 0400 07/15/20 0535 07/15/20 0731 07/15/20 0740  BP: (!) 160/100 (!) 158/92 (!) 158/92 (!) 163/102  Pulse: 92  90 92  Resp: 16  19 17   Temp: 98.3 F (36.8 C)   98.7 F (37.1 C)  TempSrc: Axillary   Axillary  SpO2: 95%  95% 94%  Weight: 76.3 kg     Height:        Intake/Output Summary (Last 24 hours) at 07/15/2020 0831 Last data filed at 07/14/2020 2344 Gross per 24 hour  Intake 100 ml  Output 600 ml  Net -500 ml   Filed Weights   07/14/20 1000 07/15/20 0400  Weight: 75.1 kg 76.3 kg    Examination:  General exam: NAD Respiratory system: B/L crackles.  Cardiovascular system: S 1, S 2 RRR Gastrointestinal system: BS  present, soft, nt Central nervous system: Alert Extremities:Trace edema    Data Reviewed: I have personally reviewed following labs and imaging studies  CBC: Recent Labs  Lab 07/28/20 1552 2020/07/28 1601 07/13/20 1858 07/14/20 0253 07/15/20 0721  WBC 2.7*  --  4.0 5.6 6.5  NEUTROABS 2.0  --  3.2 4.8 5.8  HGB 12.7 12.6 12.2 12.7 14.3  HCT 39.2 37.0 37.3 36.6 42.5  MCV 94.9  --  93.7 91.5 91.6  PLT 96*  --  152 159 213   Basic Metabolic Panel: Recent Labs  Lab 07-28-20 1552 July 28, 2020 1601 07/13/20 0451 07/14/20 0253 07/15/20 0721  NA 137 138 138 138 139  K 4.7 3.8 4.7 3.9 3.9  CL 100  --  102 95* 95*  CO2 27  --  24 25 28   GLUCOSE 188*  --  191* 251* 237*  BUN 13  --  15 22 26*  CREATININE 1.22*  --  1.43* 1.67* 1.63*  CALCIUM 8.5*  --  8.2* 8.2* 8.2*  MG  --   --  2.0 1.9 1.9  PHOS  --   --  3.7 3.2 3.1   GFR: Estimated Creatinine Clearance: 23.4 mL/min (A) (by C-G formula based on SCr of 1.63 mg/dL (H)). Liver Function Tests: Recent Labs  Lab 07-28-2020 1552 07/13/20 0451 07/14/20 0253 07/15/20 0721  AST 64* 57* 56* 57*  ALT 32 31 31 32  ALKPHOS 41 31* 41 47  BILITOT 1.2 1.3* 0.9 1.0  PROT 6.6 6.5 7.0 7.3  ALBUMIN 3.3* 3.2* 3.3* 3.5   No results for input(s): LIPASE, AMYLASE in the last 168 hours. No results for input(s): AMMONIA in the last 168 hours. Coagulation Profile: No results for input(s): INR, PROTIME in the last 168 hours. Cardiac Enzymes: No results for input(s): CKTOTAL, CKMB, CKMBINDEX, TROPONINI in the last 168 hours. BNP (last 3 results) No results for input(s): PROBNP in the last 8760 hours. HbA1C: Recent Labs    07/13/20 0452  HGBA1C 8.6*   CBG: Recent Labs  Lab 07/14/20 0731 07/14/20 1158 07/14/20 1657 07/14/20 2122 07/15/20 0746  GLUCAP 221* 210* 181* 181* 201*   Lipid Profile: Recent Labs    07-28-2020 1552  TRIG 167*   Thyroid Function Tests: Recent Labs    07/13/20 0453  TSH 6.720*   Anemia Panel: Recent  Labs    07/13/20 0451 07/14/20 0253  VITAMINB12  --  3,827*  FERRITIN 578* 540*   Sepsis Labs: Recent Labs  Lab 2020/07/28 1552  PROCALCITON <0.10  LATICACIDVEN 1.3    Recent Results (from the past 240 hour(s))  SARS Coronavirus 2 by RT PCR (hospital order, performed in [facility]  hospital lab) Nasopharyngeal Nasopharyngeal Swab     Status: Abnormal   Collection Time: 07/11/2020  3:52 PM   Specimen: Nasopharyngeal Swab  Result Value Ref Range Status   SARS Coronavirus 2 POSITIVE (A) NEGATIVE Final    Comment: RESULT CALLED TO, READ BACK BY AND VERIFIED WITH: Donnetta Hail RN 1720 07/14/2020 A BROWNING (NOTE) SARS-CoV-2 target nucleic acids are DETECTED  SARS-CoV-2 RNA is generally detectable in upper respiratory specimens  during the acute phase of infection.  Positive results are indicative  of the presence of the identified virus, but do not rule out bacterial infection or co-infection with other pathogens not detected by the test.  Clinical correlation with patient history and  other diagnostic information is necessary to determine patient infection status.  The expected result is negative.  Fact Sheet for Patients:   BoilerBrush.com.cy   Fact Sheet for Healthcare Providers:   https://pope.com/    This test is not yet approved or cleared by the [place] FDA and  has been authorized for detection and/or diagnosis of SARS-CoV-2 by FDA under an Emergency Use Authorization (EUA).  This EUA will remain in effect (meaning this t est can be used) for the duration of  the COVID-19 declaration under Section 564(b)(1) of the Act, 21 U.S.C. section 360-bbb-3(b)(1), unless the authorization is terminated or revoked sooner.  Performed at [facility], [address] [place], [place] [address]   Blood Culture (routine x 2)     Status: None (Preliminary result)   Collection Time: 07/05/2020  5:34 PM   Specimen: BLOOD  Result  Value Ref Range Status   Specimen Description BLOOD LEFT ANTECUBITAL  Final   Special Requests   Final    BOTTLES DRAWN AEROBIC AND ANAEROBIC Blood Culture results may not be optimal due to an inadequate volume of blood received in culture bottles   Culture   Final    NO GROWTH 2 DAYS Performed at [facility], [address], [place], [place] [address]    Report Status PENDING  Incomplete         Radiology Studies: ECHOCARDIOGRAM LIMITED  Result Date: 07/13/2020    ECHOCARDIOGRAM LIMITED REPORT   Patient Name:   NATSHA GUIDRY Date of Exam: 07/13/2020 Medical Rec #:  [id number]      Height: Accession #:    [id number]     Weight: Date of Birth:  Aug 30, 1933      BSA: Patient Age:    86 years       BP:           127/78 mmHg Patient Gender: F              HR:           70 bpm. Exam Location:  Inpatient Procedure: Limited Echo, Cardiac Doppler and Color Doppler Indications:    Dyspnea  History:        Patient has no prior history of Echocardiogram examinations.                 Signs/Symptoms:Shortness of Breath; Risk Factors:Diabetes.                 COVID+, Resp. distress.  Sonographer:    Lavenia Atlas Referring Phys: [id number] Cecille Po MELVIN  Sonographer Comments: Technically challenging study due to limited acoustic windows. COVID+ IMPRESSIONS  1. Left ventricular ejection fraction, by estimation, is 30 to 35%. The left ventricle has moderately decreased function. The left ventricle demonstrates global  hypokinesis. There is mild left ventricular hypertrophy. Left ventricular diastolic parameters are consistent with Grade I diastolic dysfunction (impaired relaxation).  2. Right ventricular systolic function is moderately reduced. The right ventricular size is normal. There is normal pulmonary artery systolic pressure. The estimated right ventricular systolic pressure is 23.8 mmHg.  3. The aortic valve is tricuspid. Aortic valve regurgitation is not visualized. Mild aortic valve sclerosis is  present, with no evidence of aortic valve stenosis.  4. No evidence of mitral valve regurgitation. No evidence of mitral stenosis.  5. The inferior vena cava is normal in size with greater than 50% respiratory variability, suggesting right atrial pressure of 3 mmHg.  6. A small pericardial effusion is present. FINDINGS  Left Ventricle: Left ventricular ejection fraction, by estimation, is 30 to 35%. The left ventricle has moderately decreased function. The left ventricle demonstrates global hypokinesis. The left ventricular internal cavity size was normal in size. There is mild left ventricular hypertrophy. Left ventricular diastolic parameters are consistent with Grade I diastolic dysfunction (impaired relaxation). Right Ventricle: The right ventricular size is normal. No increase in right ventricular wall thickness. Right ventricular systolic function is moderately reduced. There is normal pulmonary artery systolic pressure. The tricuspid regurgitant velocity is 2.28 m/s, and with an assumed right atrial pressure of 3 mmHg, the estimated right ventricular systolic pressure is 23.8 mmHg. Pericardium: A small pericardial effusion is present. Mitral Valve: There is mild calcification of the mitral valve leaflet(s). Mild mitral annular calcification. No evidence of mitral valve stenosis. Tricuspid Valve: The tricuspid valve is normal in structure. Tricuspid valve regurgitation is trivial. Aortic Valve: The aortic valve is tricuspid. Aortic valve regurgitation is not visualized. Mild aortic valve sclerosis is present, with no evidence of aortic valve stenosis. Aortic valve peak gradient measures 3.3 mmHg. Aorta: The aortic root is normal in size and structure. Venous: The inferior vena cava is normal in size with greater than 50% respiratory variability, suggesting right atrial pressure of 3 mmHg. LEFT VENTRICLE PLAX 2D LVIDd:         4.00 cm      Diastology LVIDs:         2.80 cm      LV e' medial:    4.35 cm/s LV PW:          1.30 cm      LV E/e' medial:  10.7 LV IVS:        1.30 cm      LV e' lateral:   4.68 cm/s LVOT diam:     2.00 cm      LV E/e' lateral: 10.0 LVOT Area:     3.14 cm  LV Volumes (MOD) LV vol d, MOD A4C: 111.0 ml LV vol s, MOD A4C: 47.4 ml LV SV MOD A4C:     111.0 ml RIGHT VENTRICLE RV S prime:     5.11 cm/s LEFT ATRIUM LA diam:    2.50 cm  AORTIC VALVE AV Vmax:      90.80 cm/s AV Peak Grad: 3.3 mmHg  AORTA Ao Root diam: 2.80 cm MITRAL VALVE               TRICUSPID VALVE MV Area (PHT): 3.85 cm    TR Peak grad:   20.8 mmHg MV Decel Time: 197 msec    TR Vmax:        228.00 cm/s MV E velocity: 46.70 cm/s MV A velocity: 70.40 cm/s  SHUNTS MV E/A ratio:  0.66  Systemic Diam: 2.00 cm Marca Ancona MD Electronically signed by Marca Ancona MD Signature Date/Time: 07/13/2020/10:47:24 PM    Final         Scheduled Meds: . vitamin C  500 mg Oral Daily  . baricitinib  1 mg Oral Daily  . dexamethasone (DECADRON) injection  10 mg Intravenous Q12H  . enoxaparin (LOVENOX) injection  30 mg Subcutaneous Q24H  . furosemide  20 mg Intravenous Daily  . hydrALAZINE  25 mg Oral Q8H  . insulin aspart  0-15 Units Subcutaneous TID WC  . levothyroxine  25 mcg Oral Q0600  . sodium chloride flush  3 mL Intravenous Q12H  . sodium chloride flush  3 mL Intravenous Q12H  . zinc sulfate  220 mg Oral Daily   Continuous Infusions: . sodium chloride    . remdesivir 100 mg in NS 100 mL Stopped (07/14/20 1813)     LOS: 3 days    Time spent: 35 minutes.     Alba Cory, MD [facility]   If 7PM-7AM, please contact night-coverage [email or web address]  07/15/2020, 8:31 AM

## 2020-07-15 NOTE — Plan of Care (Signed)
  Problem: Education: Goal: Knowledge of risk factors and measures for prevention of condition will improve Outcome: Progressing   Problem: Coping: Goal: Psychosocial and spiritual needs will be supported Outcome: Progressing   Problem: Respiratory: Goal: Will maintain a patent airway Outcome: Progressing Goal: Complications related to the disease process, condition or treatment will be avoided or minimized Outcome: Progressing   Problem: Safety: Goal: Non-violent Restraint(s) Outcome: Progressing   Problem: Safety: Goal: Non-violent Restraint(s) Outcome: Progressing

## 2020-07-15 NOTE — Progress Notes (Signed)
Bilateral wrist restraints removed due to decreased agitation. Soft mits remain in place to prevent removal of non rebreather mask.

## 2020-07-15 NOTE — Progress Notes (Signed)
Patient refusing PO meds stating " I have had enough medicine". I educated the patient regarding the importance of taking her medication. Will attempt PO meds again later. IV remdesivir, decadron, and lasix given prior along with 5 units insulin.

## 2020-07-16 DIAGNOSIS — J9601 Acute respiratory failure with hypoxia: Secondary | ICD-10-CM

## 2020-07-16 DIAGNOSIS — R4182 Altered mental status, unspecified: Secondary | ICD-10-CM

## 2020-07-16 LAB — COMPREHENSIVE METABOLIC PANEL
ALT: 31 U/L (ref 0–44)
AST: 62 U/L — ABNORMAL HIGH (ref 15–41)
Albumin: 3.3 g/dL — ABNORMAL LOW (ref 3.5–5.0)
Alkaline Phosphatase: 52 U/L (ref 38–126)
Anion gap: 13 (ref 5–15)
BUN: 33 mg/dL — ABNORMAL HIGH (ref 8–23)
CO2: 31 mmol/L (ref 22–32)
Calcium: 7.9 mg/dL — ABNORMAL LOW (ref 8.9–10.3)
Chloride: 95 mmol/L — ABNORMAL LOW (ref 98–111)
Creatinine, Ser: 1.8 mg/dL — ABNORMAL HIGH (ref 0.44–1.00)
GFR, Estimated: 27 mL/min — ABNORMAL LOW (ref >60)
Glucose, Bld: 186 mg/dL — ABNORMAL HIGH (ref 70–99)
Potassium: 3.6 mmol/L (ref 3.5–5.1)
Sodium: 139 mmol/L (ref 135–145)
Total Bilirubin: 1.1 mg/dL (ref 0.3–1.2)
Total Protein: 6.8 g/dL (ref 6.5–8.1)

## 2020-07-16 LAB — PHOSPHORUS: Phosphorus: 3.2 mg/dL (ref 2.5–4.6)

## 2020-07-16 LAB — GLUCOSE, CAPILLARY
Glucose-Capillary: 160 mg/dL — ABNORMAL HIGH (ref 70–99)
Glucose-Capillary: 175 mg/dL — ABNORMAL HIGH (ref 70–99)
Glucose-Capillary: 180 mg/dL — ABNORMAL HIGH (ref 70–99)
Glucose-Capillary: 202 mg/dL — ABNORMAL HIGH (ref 70–99)
Glucose-Capillary: 191 mg/dL — ABNORMAL HIGH (ref 70–99)

## 2020-07-16 LAB — CBC WITH DIFFERENTIAL/PLATELET
Abs Immature Granulocytes: 0.05 10*3/uL (ref 0.00–0.07)
Basophils Absolute: 0 10*3/uL (ref 0.0–0.1)
Basophils Relative: 0 %
Eosinophils Absolute: 0 10*3/uL (ref 0.0–0.5)
Eosinophils Relative: 0 %
HCT: 40 % (ref 36.0–46.0)
Hemoglobin: 13.4 g/dL (ref 12.0–15.0)
Immature Granulocytes: 1 %
Lymphocytes Relative: 8 %
Lymphs Abs: 0.5 10*3/uL — ABNORMAL LOW (ref 0.7–4.0)
MCH: 30.6 pg (ref 26.0–34.0)
MCHC: 33.5 g/dL (ref 30.0–36.0)
MCV: 91.3 fL (ref 80.0–100.0)
Monocytes Absolute: 0.2 10*3/uL (ref 0.1–1.0)
Monocytes Relative: 3 %
Neutro Abs: 6.2 10*3/uL (ref 1.7–7.7)
Neutrophils Relative %: 88 %
Platelets: 198 10*3/uL (ref 150–400)
RBC: 4.38 MIL/uL (ref 3.87–5.11)
RDW: 14.1 % (ref 11.5–15.5)
WBC: 7 10*3/uL (ref 4.0–10.5)
nRBC: 0 % (ref 0.0–0.2)

## 2020-07-16 LAB — BLOOD GAS, ARTERIAL
Acid-Base Excess: 7.6 mmol/L — ABNORMAL HIGH (ref 0.0–2.0)
Bicarbonate: 31.5 mmol/L — ABNORMAL HIGH (ref 20.0–28.0)
Drawn by: 44135
FIO2: 80
O2 Saturation: 86.4 %
Patient temperature: 37
pCO2 arterial: 43.9 mmHg (ref 32.0–48.0)
pH, Arterial: 7.47 — ABNORMAL HIGH (ref 7.350–7.450)
pO2, Arterial: 52.8 mmHg — ABNORMAL LOW (ref 83.0–108.0)

## 2020-07-16 LAB — MAGNESIUM: Magnesium: 1.9 mg/dL (ref 1.7–2.4)

## 2020-07-16 LAB — D-DIMER, QUANTITATIVE: D-Dimer, Quant: 1.37 ug/mL-FEU — ABNORMAL HIGH (ref 0.00–0.50)

## 2020-07-16 LAB — FERRITIN: Ferritin: 592 ng/mL — ABNORMAL HIGH (ref 11–307)

## 2020-07-16 LAB — C-REACTIVE PROTEIN: CRP: 5.3 mg/dL — ABNORMAL HIGH (ref <1.0)

## 2020-07-16 MED ORDER — HALOPERIDOL LACTATE 5 MG/ML IJ SOLN
1.0000 mg | Freq: Once | INTRAMUSCULAR | Status: AC
  Administered 2020-07-16: 1 mg via INTRAMUSCULAR
  Filled 2020-07-16: qty 1

## 2020-07-16 NOTE — Progress Notes (Signed)
Code Stroke cancelled. Neuro assessed patient and ruled out stroke. Per MD will keep patient NPO for now and continue current plan of care.

## 2020-07-16 NOTE — Progress Notes (Signed)
Code Stroke called at 0308 - see flowsheet.

## 2020-07-16 NOTE — Progress Notes (Incomplete)
Dr. Martyn Malay and RR RN notified that the patient has developed a right sided droop. Last seen at baseline two hours ago. CBG 160

## 2020-07-16 NOTE — Evaluation (Signed)
Clinical/Bedside Swallow Evaluation Patient Details  Name: Kaitlyn Novak MRN: [id number] Date of Birth: [birth year]  Today's Date: 07/16/2020 Time: SLP Start Time (ACUTE ONLY): 1625 SLP Stop Time (ACUTE ONLY): 1645 SLP Time Calculation (min) (ACUTE ONLY): 20 min  Past Medical History:  Past Medical History:  Diagnosis Date  . Thyroid disease    Past Surgical History: History reviewed. No pertinent surgical history. HPI:  87 y.o. female with unclear past medical history (?thyroid disease) who presented to ED from [place] primary care due to respiratory distress and hypoxia. dx:  Covid+   Assessment / Plan / Recommendation Clinical Impression  Patient presents with a mod oropharyngeal dysphagia which appears significantly impacted by current decreased respiratory status and confusion and patient not interacting with people or environment. She exhibited prolonged holding of small amount of ice chips, with no attempts to masticate. She accepted sips of water from spoon and one time from cup and although no coughing or throat clearing observed, swallow initiation suspected to be delayed and patient is largely non verbal and unable to adequately follow directions or respond to even basic questions. SpO2 percentage remained in low to mid-90's% Recommend ice chips, water sips PRN in small amounts after oral care. SLP Visit Diagnosis: Dysphagia, unspecified (R13.10)    Aspiration Risk  Moderate aspiration risk    Diet Recommendation NPO;Ice chips PRN after oral care   Medication Administration: Via alternative means Supervision: Staff to assist with self feeding;Full supervision/cueing for compensatory strategies Postural Changes: Seated upright at 90 degrees    Other  Recommendations Oral Care Recommendations: Oral care QID   Follow up Recommendations Other (comment) (TBD)      Frequency and Duration min 1 x/week          Prognosis Prognosis for Safe Diet Advancement: Good       Swallow Study   General Date of Onset: 07/16/2020 HPI: 87 y.o. female with unclear past medical history (?thyroid disease) who presented to ED from [place] primary care due to respiratory distress and hypoxia. dx:  Covid+ Type of Study: Bedside Swallow Evaluation Previous Swallow Assessment: None Diet Prior to this Study: NPO Temperature Spikes Noted: No Respiratory Status: Venti-mask History of Recent Intubation: No Behavior/Cognition: Alert;Confused;Requires cueing Oral Cavity Assessment: Dry Oral Care Completed by SLP: Yes Oral Cavity - Dentition: Adequate natural dentition Self-Feeding Abilities: Total assist Patient Positioning: Upright in bed Baseline Vocal Quality: Low vocal intensity Volitional Cough: Cognitively unable to elicit Volitional Swallow: Unable to elicit    Oral/Motor/Sensory Function Overall Oral Motor/Sensory Function: Within functional limits   Ice Chips Ice chips: Impaired Oral Phase Impairments: Impaired mastication Oral Phase Functional Implications: Prolonged oral transit Pharyngeal Phase Impairments: Suspected delayed Swallow   Thin Liquid Thin Liquid: Impaired Presentation: Spoon;Cup Oral Phase Impairments: Reduced labial seal;Poor awareness of bolus Pharyngeal  Phase Impairments: Suspected delayed Swallow;Decreased hyoid-laryngeal movement    Nectar Thick   NT  Honey Thick   NT  Puree   NT  Solid       NT    Angela Nevin, MA, CCC-SLP Speech Therapy [facility]

## 2020-07-16 NOTE — Progress Notes (Signed)
RN assisted NT with patient care at 0200. Upon finishing care, RN noticed a change mentation and slight lip asymmetry on the right side and informed CN and MD of changes. MD at bed side.  Patient vitals are as follows:  BP 106/85  HR 71 bpm Temp 98.1 F 85% O2 15L HFNC with NRB BS 160

## 2020-07-16 NOTE — Progress Notes (Addendum)
HOSPITAL MEDICINE OVERNIGHT EVENT NOTE    Notified by nursing just prior to 3 AM that patient was exhibiting substantial lethargy and questionable right facial droop.  Upon evaluating patient at the bedside, patient did have a visible right facial droop although it was unclear as to whether or not this was secondary to patient positioning or some degree of dependent edema of the face.  Patient is extremely lethargic and not following commands for the most part.  Assessment of strength of the extremities has proven to be extremely difficult due to significant lethargy and inability to participate in exam.  I obtained an ABG that revealed a pH of 7.47 with PCO2 42.9 and PO2 of 52.8 on 15L of high flow via salter as well as 100% nonrebreather mask.  Nursing activated a code stroke.  Dr. Otelia Limes promptly responded and evaluated the patient at the bedside.  Based on his assessment, he did not identify any focal neurologic deficits and felt that an acute stroke was unlikely.  He felt that the patient's change in mentation was likely secondary to delirium secondary to underlying illness.  Holding off on stat CT imaging of the brain for now.  Continue to monitor patient closely.  Proceeding with serial neurologic checks per neurology recommendations.  Patient NPO until mentation improves.   I have additionally called the daughter and updated her on these events.  Marinda Elk  MD [facility]

## 2020-07-16 NOTE — Progress Notes (Addendum)
PROGRESS NOTE    Kaitlyn Novak  DZH:[id number] DOB: 04-08-1934 DOA: 06/30/2020 PCP: Patient, No Pcp Per   Brief Narrative: 87 year old unclear past medical history, thyroid disease, presents from [facility] due to respiratory distress and hypoxia.  Patient recently moved in with her daughter from [place] over the summer.  Patient currently only taking daily vitamins at this time.  She went to [facility] physician for evaluation of a slowly worsening shortness of breath over the last month  and establish care.  Patient was found to be hypoxic oxygen saturation in the 70s, increased work of breathing.  EMS was contacted, patient was a started on 15 L nonrebreather.  Still having worsening memory problems.  Evaluation in the ED: Patient oxygen saturation 90 on 6 L, creatinine 1.2, white blood cell 2.7, platelet 96, troponin 51.  Covid PCR positive.  Procalcitonin 0.1    Assessment & Plan:   Principal Problem:   Acute respiratory failure with hypoxia (HCC) Active Problems:   Volume overload   COVID-19 virus infection   Troponin level elevated   Creatinine elevation   1-Acute hypoxic respiratory failure, COVID-19 pneumonia, pulmonary edema volume overload -Continue with IV Lasix. Reduce dose to avoid dehydration.  -Continue with Remdesivir day 4 -Continue with IV Decadron -Continue with vitamin C and zinc inhaler -Continue to follow inflammatory markers -Started Baricitinib 1/28. -Discussed with daughter, patient is DNR/DNI.  -Patient currently requiring HF and NBM -inflammatory markers started to decreased.   COVID-19 Labs  Recent Labs    07/14/20 0253 07/15/20 0721 07/16/20 0328  DDIMER 1.92* 1.92* 1.37*  FERRITIN 540* 615* 592*  CRP 8.7* 6.6* 5.3*    Lab Results  Component Value Date   SARSCOV2NAA POSITIVE (A) 06/18/2020   2-Volume overload, lower extremity edema, pulmonary edema: Likely secondary  to heart failure Echo showed decreased Ef 35 %, wall motion  abnormalities.  Cardiology consulted.  Appreciate cardiology.  Hold lasix due to increase Cr.   3-Leukopenia; secondary to covid.   4-Mild elevation troponin. Chest pain free.  Related to covid vs heart failure.   5-Hypothyroidism;  TSH at 6. Started  synthroid.   6-Diabetes type 2;  HbA1c at 8.6 Start SSI.   AKI;  Cr increase to 1.8. will hold lasix.   Acute metabolic encephalopathy; Memory deficit;  Became more lethargic last night. She is alert this am, confuse.  continue to monitor.  Start synthroid.  B12 elevated, not low.   I offer Daughter, palliative care consult for goals of care, to discuss about comfort measure if her mother condition get worse. She wants to think about.   Estimated body mass index is 35.43 kg/m as calculated from the following:   Height as of this encounter: 5' (1.524 m).   Weight as of this encounter: 82.3 kg.   DVT prophylaxis: Lovenox.  Code Status: Full code Family Communication; will update daughter  Disposition Plan:  Status is: Inpatient  Remains inpatient appropriate because:IV treatments appropriate due to intensity of illness or inability to take PO   Dispo: The patient is from: Home              Anticipated d/c is to: Home              Anticipated d/c date is: 3 days              Patient currently is not medically stable to d/c.   Difficult to place patient No  Consultants:   None  Procedures:   ECHO ef 30--35 %  Antimicrobials:    Subjective: She is alert, confuse. Relates she is cold.   Objective: Vitals:   07/16/20 0400 07/16/20 0721 07/16/20 0830 07/16/20 1134  BP: 120/88 104/70  (!) 150/122  Pulse: 91 86 86 86  Resp: 16 16 16 15   Temp: 97.6 F (36.4 C) 97.8 F (36.6 C)  97.9 F (36.6 C)  TempSrc: Axillary Oral    SpO2: 94% 99% 100% 98%  Weight: 82.3 kg     Height:        Intake/Output Summary (Last 24 hours) at 07/16/2020 1353 Last data filed at 07/15/2020 2327 Gross per 24 hour   Intake -  Output 700 ml  Net -700 ml   Filed Weights   07/14/20 1000 07/15/20 0400 07/16/20 0400  Weight: 75.1 kg 76.3 kg 82.3 kg    Examination:  General exam: NAD Respiratory system: B/L crackles  Cardiovascular system: S 1, S 2 RRR Gastrointestinal system: BS present,sfot, nt Central nervous system: alert, confuse Extremities:Trace edema    Data Reviewed: I have personally reviewed following labs and imaging studies  CBC: Recent Labs  Lab 07/05/2020 1552 06/28/2020 1601 07/13/20 1858 07/14/20 0253 07/15/20 0721 07/16/20 0328  WBC 2.7*  --  4.0 5.6 6.5 7.0  NEUTROABS 2.0  --  3.2 4.8 5.8 6.2  HGB 12.7 12.6 12.2 12.7 14.3 13.4  HCT 39.2 37.0 37.3 36.6 42.5 40.0  MCV 94.9  --  93.7 91.5 91.6 91.3  PLT 96*  --  152 159 213 198   Basic Metabolic Panel: Recent Labs  Lab 07/09/2020 1552 06/22/2020 1601 07/13/20 0451 07/14/20 0253 07/15/20 0721 07/16/20 0328  NA 137 138 138 138 139 139  K 4.7 3.8 4.7 3.9 3.9 3.6  CL 100  --  102 95* 95* 95*  CO2 27  --  24 25 28 31   GLUCOSE 188*  --  191* 251* 237* 186*  BUN 13  --  15 22 26* 33*  CREATININE 1.22*  --  1.43* 1.67* 1.63* 1.80*  CALCIUM 8.5*  --  8.2* 8.2* 8.2* 7.9*  MG  --   --  2.0 1.9 1.9 1.9  PHOS  --   --  3.7 3.2 3.1 3.2   GFR: Estimated Creatinine Clearance: 22.1 mL/min (A) (by C-G formula based on SCr of 1.8 mg/dL (H)). Liver Function Tests: Recent Labs  Lab 06/25/2020 1552 07/13/20 0451 07/14/20 0253 07/15/20 0721 07/16/20 0328  AST 64* 57* 56* 57* 62*  ALT 32 31 31 32 31  ALKPHOS 41 31* 41 47 52  BILITOT 1.2 1.3* 0.9 1.0 1.1  PROT 6.6 6.5 7.0 7.3 6.8  ALBUMIN 3.3* 3.2* 3.3* 3.5 3.3*   No results for input(s): LIPASE, AMYLASE in the last 168 hours. No results for input(s): AMMONIA in the last 168 hours. Coagulation Profile: No results for input(s): INR, PROTIME in the last 168 hours. Cardiac Enzymes: No results for input(s): CKTOTAL, CKMB, CKMBINDEX, TROPONINI in the last 168 hours. BNP (last  3 results) No results for input(s): PROBNP in the last 8760 hours. HbA1C: No results for input(s): HGBA1C in the last 72 hours. CBG: Recent Labs  Lab 07/15/20 2018 07/15/20 2123 07/16/20 0226 07/16/20 0723 07/16/20 1203  GLUCAP 209* 235* 160* 175* 180*   Lipid Profile: No results for input(s): CHOL, HDL, LDLCALC, TRIG, CHOLHDL, LDLDIRECT in the last 72 hours. Thyroid Function Tests: No results for input(s): TSH, T4TOTAL, FREET4, T3FREE,  THYROIDAB in the last 72 hours. Anemia Panel: Recent Labs    07/14/20 0253 07/15/20 0721 07/16/20 0328  VITAMINB12 3,827*  --   --   FERRITIN 540* 615* 592*   Sepsis Labs: Recent Labs  Lab 07/06/2020 1552  PROCALCITON <0.10  LATICACIDVEN 1.3    Recent Results (from the past 240 hour(s))  SARS Coronavirus 2 by RT PCR (hospital order, performed in [facility] hospital lab) Nasopharyngeal Nasopharyngeal Swab     Status: Abnormal   Collection Time: 06/24/2020  3:52 PM   Specimen: Nasopharyngeal Swab  Result Value Ref Range Status   SARS Coronavirus 2 POSITIVE (A) NEGATIVE Final    Comment: RESULT CALLED TO, READ BACK BY AND VERIFIED WITH: Donnetta Hail RN 1720 07/15/2020 A BROWNING (NOTE) SARS-CoV-2 target nucleic acids are DETECTED  SARS-CoV-2 RNA is generally detectable in upper respiratory specimens  during the acute phase of infection.  Positive results are indicative  of the presence of the identified virus, but do not rule out bacterial infection or co-infection with other pathogens not detected by the test.  Clinical correlation with patient history and  other diagnostic information is necessary to determine patient infection status.  The expected result is negative.  Fact Sheet for Patients:   BoilerBrush.com.cy   Fact Sheet for Healthcare Providers:   https://pope.com/    This test is not yet approved or cleared by the [place] FDA and  has been authorized for detection and/or  diagnosis of SARS-CoV-2 by FDA under an Emergency Use Authorization (EUA).  This EUA will remain in effect (meaning this t est can be used) for the duration of  the COVID-19 declaration under Section 564(b)(1) of the Act, 21 U.S.C. section 360-bbb-3(b)(1), unless the authorization is terminated or revoked sooner.  Performed at [facility], [address], [place], [place] [address]   Blood Culture (routine x 2)     Status: None (Preliminary result)   Collection Time: 06/30/2020  5:34 PM   Specimen: BLOOD  Result Value Ref Range Status   Specimen Description BLOOD LEFT ANTECUBITAL  Final   Special Requests   Final    BOTTLES DRAWN AEROBIC AND ANAEROBIC Blood Culture results may not be optimal due to an inadequate volume of blood received in culture bottles   Culture   Final    NO GROWTH 4 DAYS Performed at [facility], [address] [place], [place] [address]    Report Status PENDING  Incomplete         Radiology Studies: No results found.      Scheduled Meds: . vitamin C  500 mg Oral Daily  . baricitinib  1 mg Oral Daily  . dexamethasone (DECADRON) injection  10 mg Intravenous Q12H  . enoxaparin (LOVENOX) injection  30 mg Subcutaneous Q24H  . hydrALAZINE  25 mg Oral Q8H  . insulin aspart  0-15 Units Subcutaneous TID WC  . levothyroxine  25 mcg Oral Q0600  . sodium chloride flush  3 mL Intravenous Q12H  . sodium chloride flush  3 mL Intravenous Q12H  . zinc sulfate  220 mg Oral Daily   Continuous Infusions: . sodium chloride       LOS: 4 days    Time spent: 35 minutes.     Alba Cory, MD [facility]   If 7PM-7AM, please contact night-coverage [email or web address]  07/16/2020, 1:53 PM

## 2020-07-16 NOTE — Consult Note (Signed)
NEURO HOSPITALIST CONSULT NOTE   Requestig physician: Dr. Leafy Half  Reason for Consult: Acute onset of AMS and possible facial droop  History obtained from:  Floor Team and Chart     HPI:                                                                                                                                          Kaitlyn Novak is an 87 y.o. female who was admitted for management of Covid PNA on 1/26. During an RN check early this AM, the patient was altered more so than her persistently confused baseline, with equivocal facial droop due to slight lip asymmetry. She was generally weak, but no unilateral weakness was noted by Hospitalist. LKN was midnight. Code Stroke was called.   Of note, the patient recently had a hypoxic episode requiring additional supporf from NRB mask at 15L in addition to HFNC.   Past Medical History:  Diagnosis Date  . Thyroid disease     History reviewed. No pertinent surgical history.  Family History  Problem Relation Age of Onset  . Lung disease Father               Social History:  has no history on file for tobacco use, alcohol use, and drug use.  No Known Allergies  MEDICATIONS:                                                                                                                     Scheduled: . vitamin C  500 mg Oral Daily  . baricitinib  1 mg Oral Daily  . dexamethasone (DECADRON) injection  10 mg Intravenous Q12H  . enoxaparin (LOVENOX) injection  30 mg Subcutaneous Q24H  . furosemide  20 mg Intravenous Daily  . hydrALAZINE  25 mg Oral Q8H  . insulin aspart  0-15 Units Subcutaneous TID WC  . levothyroxine  25 mcg Oral Q0600  . sodium chloride flush  3 mL Intravenous Q12H  . sodium chloride flush  3 mL Intravenous Q12H  . zinc sulfate  220 mg Oral Daily   Continuous: . sodium chloride    . remdesivir 100 mg in NS 100 mL 100 mg (07/15/20 0926)     ROS:  Unable to obtain due to SOB and AMS.    Blood pressure 106/85, pulse 83, temperature 98 F (36.7 C), temperature source Axillary, resp. rate 17, height 5' (1.524 m), weight 76.3 kg, SpO2 94 %.   General Examination:                                                                                                       Physical Exam  HEENT-  [place]/AT. NRB mask in place. Oral mucosa with decreased hydration. Right lower lip is fuller than on the left.  Lungs- Somewhat labored respirations Extremities- No edema   Neurological Examination Mental Status: Awake with decreased level of alertness. Not following commands or answering questions, but will exclaim and become more alert after repeated irritative stimuli.  Cranial Nerves: II: Will fixate on examiner's face after irritative stimuli. PERRL.   III,IV, VI: No ptosis. Eyes are conjugate and near the midline. Will fixate visually.  V,VII: Will grimace to noxious brow ridge pressure bilaterally, with equal contractions to upper and lower quadrants of face.  VIII: Not following verbal commands IX,X: Deferred gag reflex XI: Head is midlline XII: Midline tongue extension Motor/Sensory: Will maintain BUE in elevated position without drift after being passively raised and with constant loud coaching and encouragement by team. Does not follow commands for formal motor testing of BUE.  BLE will withdraw vigorously to noxious plantar stimulation, with more vigorous responses proportional to degree of noxious stimulus applied. No asymmetry noted.  Tone is normal x 4.  Deep Tendon Reflexes: No asymmetry noted.  Plantars: Right: downgoing  Left: downgoing Cerebellar: Not following commands for assessment Gait: Deferred   Lab Results: Basic Metabolic Panel: Recent Labs  Lab 07/04/2020 1552 07/02/2020 1601 07/13/20 0451 07/14/20 0253 07/15/20 0721  NA 137 138  138 138 139  K 4.7 3.8 4.7 3.9 3.9  CL 100  --  102 95* 95*  CO2 27  --  24 25 28   GLUCOSE 188*  --  191* 251* 237*  BUN 13  --  15 22 26*  CREATININE 1.22*  --  1.43* 1.67* 1.63*  CALCIUM 8.5*  --  8.2* 8.2* 8.2*  MG  --   --  2.0 1.9 1.9  PHOS  --   --  3.7 3.2 3.1    CBC: Recent Labs  Lab 07/01/2020 1552 07/08/2020 1601 07/13/20 1858 07/14/20 0253 07/15/20 0721 07/16/20 0328  WBC 2.7*  --  4.0 5.6 6.5 7.0  NEUTROABS 2.0  --  3.2 4.8 5.8 6.2  HGB 12.7 12.6 12.2 12.7 14.3 13.4  HCT 39.2 37.0 37.3 36.6 42.5 40.0  MCV 94.9  --  93.7 91.5 91.6 91.3  PLT 96*  --  152 159 213 198    Cardiac Enzymes: No results for input(s): CKTOTAL, CKMB, CKMBINDEX, TROPONINI in the last 168 hours.  Lipid Panel: Recent Labs  Lab 07/06/2020 1552  TRIG 167*    Imaging: No results found.  Assessment: 87 year old female with Covid PNA and persistent AMS since admission, now with transiently worsened mentation in the  setting of increasing O2 requirement 1. No focal weakness seen on exam. Overall presentation most consistent with AMS in the setting of increasing O2 requirement and possible hospital delirium 2. Given low clinical suspicion for acute stroke, will hold off on STAT imaging.  3. Frequent neuro checks.   Recommendations: 1. Close monitoring 2. Continue management of her Covid PNA per hospital protocols 3. Please call the Neurology team if there are additional questions.   Electronically signed: Dr. Caryl Pina 07/16/2020, 3:49 AM

## 2020-07-16 NOTE — Progress Notes (Signed)
I was asked to see Kaitlyn Novak regarding possible facial droop. Kaitlyn Novak recently had an hypoxic episode requiring additional support from NRB mask at 15L in addition to HFNC. Kaitlyn Novak has been confused since admission and is reportedly a little more confused now. Upon my arrival, Kaitlyn Novak was alert, intermittently following few commands, moaning but not verbalizing any response. She is generally weak, but no unilateral weakness recognized. Her lips were not symmetrical at rest, but her nasolabial folds moved symmetrically and both eyebrows flexed equally. Staff stated her droop had improved as her oxygen saturations improved. Her oxygen saturations were 90-92% at the time of my exam. Her mouth is dry and sticky which could also contribute to some movement of her lips. CBG was confirmed earlier at 160. With her improvement in exam since oxygen levels have stabilized, changes could be related to her hypoxic event.

## 2020-07-16 NOTE — Progress Notes (Signed)
Occupational Therapy Treatment Patient Details Name: Kaitlyn Novak MRN: [id number] DOB: 04/26/1934 Today's Date: 07/16/2020    History of present illness 87 y.o. female with unclear past medical history (?thyroid disease) who presented to ED from [facility] due to respiratory distress and hypoxia. dx:  Covid+   OT comments  Pt with notable change in status/decline since most recent therapy session. Pt lethargic today and with poor command follow throughout session. She required totalA for repositioning in bed and max hand over hand assist to engage in simple ADL task. Pt was able to state her name and intermittently shaking head "yes" in response to some questions. SpO2 maintaining >95% on 15L HFNC + NRB. Given current status have updated d/c recommendations as she is now more appropriate for SNF level therapies prior to return home. Acute OT to follow.    Follow Up Recommendations  SNF;Supervision/Assistance - 24 hour    Equipment Recommendations  None recommended by OT          Precautions / Restrictions Precautions Precautions: Fall;Other (comment) Precaution Comments: impaired cognition Restrictions Weight Bearing Restrictions: No       Mobility Bed Mobility Overal bed mobility: Needs Assistance             General bed mobility comments: requiring totalA to reposition in bed, pt unable to follow commands to assist  Transfers                 General transfer comment: unable    Balance                                           ADL either performed or assessed with clinical judgement   ADL Overall ADL's : Needs assistance/impaired                                       General ADL Comments: pt requiring totalA for ADL today, max hand over hand assist for simple face washing task but with minimal ability to carryover on her own                       Cognition Arousal/Alertness:  Lethargic;Awake/alert Behavior During Therapy: Flat affect Overall Cognitive Status: No family/caregiver present to determine baseline cognitive functioning                                 General Comments: h/o cognitive impairment. intermittently lethargic today and with very minimal responses, occasionally shaking head yes and states her first name, otherwise with no other command follow        Exercises Exercises: Other exercises Other Exercises Other Exercises: heel cord stretch bil LE Other Exercises: AA/PROM to bil UE, shoulder flexion, horizontal abd/adduction and elbow flexion/extension x5 each   Shoulder Instructions       General Comments VSS on 15L HFNC + NRB    Pertinent Vitals/ Pain       Pain Assessment: Faces Faces Pain Scale: Hurts a little bit Pain Location: generalized, pt shaking head "yes" when asked if she's in pain but unable to specify Pain Descriptors / Indicators: Discomfort Pain Intervention(s): Monitored during session  Home Living  Prior Functioning/Environment              Frequency  Min 2X/week        Progress Toward Goals  OT Goals(current goals can now be found in the care plan section)  Progress towards OT goals: OT to reassess next treatment  Acute Rehab OT Goals Patient Stated Goal: home per daughter OT Goal Formulation: With patient Time For Goal Achievement: 07/28/20 Potential to Achieve Goals: Fair ADL Goals Pt Will Perform Grooming: with supervision;sitting Pt Will Perform Upper Body Bathing: with set-up;with supervision;sitting Pt Will Perform Lower Body Bathing: with min assist;sit to/from stand Pt Will Perform Upper Body Dressing: with set-up;with supervision;sitting Pt Will Perform Lower Body Dressing: with min assist;sit to/from stand Pt Will Transfer to Toilet: with min assist;stand pivot transfer;bedside commode Pt Will Perform Toileting -  Clothing Manipulation and hygiene: with min assist;sit to/from stand  Plan Discharge plan needs to be updated    Co-evaluation                 AM-PAC OT "6 Clicks" Daily Activity     Outcome Measure   Help from another person eating meals?: Total Help from another person taking care of personal grooming?: Total Help from another person toileting, which includes using toliet, bedpan, or urinal?: Total Help from another person bathing (including washing, rinsing, drying)?: Total Help from another person to put on and taking off regular upper body clothing?: Total Help from another person to put on and taking off regular lower body clothing?: Total 6 Click Score: 6    End of Session Equipment Utilized During Treatment: Oxygen  OT Visit Diagnosis: Unsteadiness on feet (R26.81);Cognitive communication deficit (R41.841)   Activity Tolerance Patient limited by lethargy   Patient Left in bed;with call bell/phone within reach;with bed alarm set   Nurse Communication Mobility status        Time: 2683-4196 OT Time Calculation (min): 18 min  Charges: OT General Charges $OT Visit: 1 Visit OT Treatments $Self Care/Home Management : 8-22 mins  Marcy Siren, OT Acute Rehabilitation Services Pager [phone] Office [phone]    Orlando Penner 07/16/2020, 4:47 PM

## 2020-07-17 DIAGNOSIS — J9601 Acute respiratory failure with hypoxia: Secondary | ICD-10-CM | POA: Diagnosis not present

## 2020-07-17 LAB — CBC WITH DIFFERENTIAL/PLATELET
Abs Immature Granulocytes: 0.07 10*3/uL (ref 0.00–0.07)
Basophils Absolute: 0 10*3/uL (ref 0.0–0.1)
Basophils Relative: 0 %
Eosinophils Absolute: 0 10*3/uL (ref 0.0–0.5)
Eosinophils Relative: 0 %
HCT: 38.8 % (ref 36.0–46.0)
Hemoglobin: 13.1 g/dL (ref 12.0–15.0)
Immature Granulocytes: 1 %
Lymphocytes Relative: 4 %
Lymphs Abs: 0.3 10*3/uL — ABNORMAL LOW (ref 0.7–4.0)
MCH: 31 pg (ref 26.0–34.0)
MCHC: 33.8 g/dL (ref 30.0–36.0)
MCV: 91.7 fL (ref 80.0–100.0)
Monocytes Absolute: 0.3 10*3/uL (ref 0.1–1.0)
Monocytes Relative: 3 %
Neutro Abs: 7.5 10*3/uL (ref 1.7–7.7)
Neutrophils Relative %: 92 %
Platelets: 199 10*3/uL (ref 150–400)
RBC: 4.23 MIL/uL (ref 3.87–5.11)
RDW: 14.2 % (ref 11.5–15.5)
WBC: 8.2 10*3/uL (ref 4.0–10.5)
nRBC: 0 % (ref 0.0–0.2)

## 2020-07-17 LAB — D-DIMER, QUANTITATIVE: D-Dimer, Quant: 1.38 ug/mL-FEU — ABNORMAL HIGH (ref 0.00–0.50)

## 2020-07-17 LAB — COMPREHENSIVE METABOLIC PANEL
ALT: 29 U/L (ref 0–44)
AST: 54 U/L — ABNORMAL HIGH (ref 15–41)
Albumin: 3.2 g/dL — ABNORMAL LOW (ref 3.5–5.0)
Alkaline Phosphatase: 58 U/L (ref 38–126)
Anion gap: 14 (ref 5–15)
BUN: 29 mg/dL — ABNORMAL HIGH (ref 8–23)
CO2: 30 mmol/L (ref 22–32)
Calcium: 8.1 mg/dL — ABNORMAL LOW (ref 8.9–10.3)
Chloride: 97 mmol/L — ABNORMAL LOW (ref 98–111)
Creatinine, Ser: 1.54 mg/dL — ABNORMAL HIGH (ref 0.44–1.00)
GFR, Estimated: 33 mL/min — ABNORMAL LOW (ref >60)
Glucose, Bld: 189 mg/dL — ABNORMAL HIGH (ref 70–99)
Potassium: 3.4 mmol/L — ABNORMAL LOW (ref 3.5–5.1)
Sodium: 141 mmol/L (ref 135–145)
Total Bilirubin: 1 mg/dL (ref 0.3–1.2)
Total Protein: 6.6 g/dL (ref 6.5–8.1)

## 2020-07-17 LAB — GLUCOSE, CAPILLARY
Glucose-Capillary: 196 mg/dL — ABNORMAL HIGH (ref 70–99)
Glucose-Capillary: 171 mg/dL — ABNORMAL HIGH (ref 70–99)
Glucose-Capillary: 234 mg/dL — ABNORMAL HIGH (ref 70–99)
Glucose-Capillary: 227 mg/dL — ABNORMAL HIGH (ref 70–99)

## 2020-07-17 LAB — CULTURE, BLOOD (ROUTINE X 2): Culture: NO GROWTH

## 2020-07-17 LAB — FERRITIN: Ferritin: 473 ng/mL — ABNORMAL HIGH (ref 11–307)

## 2020-07-17 LAB — C-REACTIVE PROTEIN: CRP: 4.3 mg/dL — ABNORMAL HIGH (ref <1.0)

## 2020-07-17 LAB — MAGNESIUM: Magnesium: 2 mg/dL (ref 1.7–2.4)

## 2020-07-17 LAB — PHOSPHORUS: Phosphorus: 2.9 mg/dL (ref 2.5–4.6)

## 2020-07-17 MED ORDER — HALOPERIDOL LACTATE 5 MG/ML IJ SOLN
0.5000 mg | Freq: Four times a day (QID) | INTRAMUSCULAR | Status: DC | PRN
  Administered 2020-07-17 – 2020-07-21 (×4): 0.5 mg via INTRAVENOUS
  Filled 2020-07-17 (×4): qty 1

## 2020-07-17 MED ORDER — HYDRALAZINE HCL 20 MG/ML IJ SOLN
10.0000 mg | Freq: Three times a day (TID) | INTRAMUSCULAR | Status: DC
  Administered 2020-07-17 – 2020-07-21 (×14): 10 mg via INTRAVENOUS
  Filled 2020-07-17 (×14): qty 1

## 2020-07-17 MED ORDER — LEVOTHYROXINE SODIUM 100 MCG/5ML IV SOLN
12.5000 ug | Freq: Every day | INTRAVENOUS | Status: DC
  Administered 2020-07-20 – 2020-07-21 (×2): 12.5 ug via INTRAVENOUS
  Filled 2020-07-17 (×3): qty 5

## 2020-07-17 MED ORDER — POTASSIUM CHLORIDE 10 MEQ/100ML IV SOLN
10.0000 meq | INTRAVENOUS | Status: AC
  Administered 2020-07-17 (×2): 10 meq via INTRAVENOUS
  Filled 2020-07-17 (×2): qty 100

## 2020-07-17 MED ORDER — ACETAMINOPHEN 10 MG/ML IV SOLN
1000.0000 mg | Freq: Two times a day (BID) | INTRAVENOUS | Status: AC
  Administered 2020-07-17 (×2): 1000 mg via INTRAVENOUS
  Filled 2020-07-17 (×2): qty 100

## 2020-07-17 NOTE — Progress Notes (Addendum)
PROGRESS NOTE    Kaitlyn Novak  KLK:[id number] DOB: 25-Mar-1934 DOA: 07/10/2020 PCP: Patient, No Pcp Per   Brief Narrative: 87 year old unclear past medical history, thyroid disease, presents from [place] primary care due to respiratory distress and hypoxia.  Patient recently moved in with her daughter from [place] over the summer.  Patient currently only taking daily vitamins at this time.  She went to [facility] physician for evaluation of a slowly worsening shortness of breath over the last month  and establish care.  Patient was found to be hypoxic oxygen saturation in the 70s, increased work of breathing.  EMS was contacted, patient was a started on 15 L nonrebreather.  Still having worsening memory problems.  Evaluation in the ED: Patient oxygen saturation 90 on 6 L, creatinine 1.2, white blood cell 2.7, platelet 96, troponin 51.  Covid PCR positive.  Procalcitonin 0.1    Assessment & Plan:   Principal Problem:   Acute respiratory failure with hypoxia (HCC) Active Problems:   Volume overload   COVID-19 virus infection   Troponin level elevated   Creatinine elevation   1-Acute hypoxic respiratory failure, COVID-19 pneumonia, pulmonary edema volume overload -Continue with IV Lasix. Reduce dose to avoid dehydration.  -Continue with Remdesivir day 4 -Continue with IV Decadron -holding vitamin C and zinc, unable to tolerates oral.  -Continue to follow inflammatory markers. Trending down.  -Started Baricitinib 1/28. -Discussed with daughter, patient is DNR/DNI.  -patient today is only requiring NBM -inflammatory markers started to decreased.   COVID-19 Labs  Recent Labs    07/15/20 0721 07/16/20 0328 07/17/20 0319  DDIMER 1.92* 1.37* 1.38*  FERRITIN 615* 592* 473*  CRP 6.6* 5.3* 4.3*    Lab Results  Component Value Date   SARSCOV2NAA POSITIVE (A) 07/10/2020   2-Acute systolic Heart failure exacerbation. Volume overload, lower extremity edema, pulmonary edema: Likely  secondary  to heart failure Echo showed decreased Ef 35 %, wall motion abnormalities.  Cardiology consulted.  Appreciate cardiology. Needs follow up out patient.  Continue to hold lasix due to AKI.   3-Leukopenia; secondary to covid.  Resolved.   4-Mild elevation troponin. Chest pain free.  Related to covid vs heart failure.   5-Hypothyroidism;  TSH at 6. Started  synthroid.   6-Diabetes type 2;  HbA1c at 8.6 Start SSI.   AKI;  Cr peak to 1.8 Cr down to 1.5.  Continue to hold lasix.   Acute metabolic encephalopathy; Memory deficit;  Likely related to hypoxemia, covid viral illness, hospital delirium.  Became more lethargic night 1/29. Alert.  continue to monitor.  Started  synthroid.  B12 elevated, not low.  Patient continue to be confuse, she is more alert.  Plan to continue NPO status, assess status tomorrow.   Hypokalemia; replete IV>  Generalized pain; I have order schedule tylenol.   Palliative care consulted for goals of care.   Estimated body mass index is 35.43 kg/m as calculated from the following:   Height as of this encounter: 5' (1.524 m).   Weight as of this encounter: 82.3 kg.   DVT prophylaxis: Lovenox.  Code Status: Full code Family Communication; Daughter updated 1/30. 1/31. Disposition Plan:  Status is: Inpatient  Remains inpatient appropriate because:IV treatments appropriate due to intensity of illness or inability to take PO   Dispo: The patient is from: Home              Anticipated d/c is to: Home  Anticipated d/c date is: 3 days              Patient currently is not medically stable to d/c.   Difficult to place patient No        Consultants:   None  Procedures:   ECHO ef 30--35 %  Antimicrobials:    Subjective: Patient is alert, sitting recliner. She complaints of pain all over. I have order schedule IV tylenol to help with pain.   Objective: Vitals:   07/16/20 2051 07/17/20 0000 07/17/20 0312  07/17/20 0700  BP: (!) 144/86 130/73 (!) 134/91 (!) 148/85  Pulse: 76 77 80 90  Resp: 15 13 15 16   Temp: 97.6 F (36.4 C) 97.9 F (36.6 C) 98.9 F (37.2 C)   TempSrc: Axillary Axillary Axillary   SpO2: (!) 89% 100% 95% 95%  Weight:      Height:       No intake or output data in the 24 hours ending 07/17/20 1447 Filed Weights   07/14/20 1000 07/15/20 0400 07/16/20 0400  Weight: 75.1 kg 76.3 kg 82.3 kg    Examination:  General exam: NAD Respiratory system: B/l crackles.  Cardiovascular system: S 1, S 2 RRR Gastrointestinal system: BS present, soft, nt Central nervous system: Alert, confuse Extremities:no edema    Data Reviewed: I have personally reviewed following labs and imaging studies  CBC: Recent Labs  Lab 07/13/20 1858 07/14/20 0253 07/15/20 0721 07/16/20 0328 07/17/20 0319  WBC 4.0 5.6 6.5 7.0 8.2  NEUTROABS 3.2 4.8 5.8 6.2 7.5  HGB 12.2 12.7 14.3 13.4 13.1  HCT 37.3 36.6 42.5 40.0 38.8  MCV 93.7 91.5 91.6 91.3 91.7  PLT 152 159 213 198 199   Basic Metabolic Panel: Recent Labs  Lab 07/13/20 0451 07/14/20 0253 07/15/20 0721 07/16/20 0328 07/17/20 0319  NA 138 138 139 139 141  K 4.7 3.9 3.9 3.6 3.4*  CL 102 95* 95* 95* 97*  CO2 24 25 28 31 30   GLUCOSE 191* 251* 237* 186* 189*  BUN 15 22 26* 33* 29*  CREATININE 1.43* 1.67* 1.63* 1.80* 1.54*  CALCIUM 8.2* 8.2* 8.2* 7.9* 8.1*  MG 2.0 1.9 1.9 1.9 2.0  PHOS 3.7 3.2 3.1 3.2 2.9   GFR: Estimated Creatinine Clearance: 25.8 mL/min (A) (by C-G formula based on SCr of 1.54 mg/dL (H)). Liver Function Tests: Recent Labs  Lab 07/13/20 0451 07/14/20 0253 07/15/20 0721 07/16/20 0328 07/17/20 0319  AST 57* 56* 57* 62* 54*  ALT 31 31 32 31 29  ALKPHOS 31* 41 47 52 58  BILITOT 1.3* 0.9 1.0 1.1 1.0  PROT 6.5 7.0 7.3 6.8 6.6  ALBUMIN 3.2* 3.3* 3.5 3.3* 3.2*   No results for input(s): LIPASE, AMYLASE in the last 168 hours. No results for input(s): AMMONIA in the last 168 hours. Coagulation  Profile: No results for input(s): INR, PROTIME in the last 168 hours. Cardiac Enzymes: No results for input(s): CKTOTAL, CKMB, CKMBINDEX, TROPONINI in the last 168 hours. BNP (last 3 results) No results for input(s): PROBNP in the last 8760 hours. HbA1C: No results for input(s): HGBA1C in the last 72 hours. CBG: Recent Labs  Lab 07/16/20 1203 07/16/20 1651 07/16/20 2048 07/17/20 0756 07/17/20 1151  GLUCAP 180* 202* 191* 196* 171*   Lipid Profile: No results for input(s): CHOL, HDL, LDLCALC, TRIG, CHOLHDL, LDLDIRECT in the last 72 hours. Thyroid Function Tests: No results for input(s): TSH, T4TOTAL, FREET4, T3FREE, THYROIDAB in the last 72 hours. Anemia Panel: Recent Labs  07/16/20 0328 07/17/20 0319  FERRITIN 592* 473*   Sepsis Labs: Recent Labs  Lab 06/29/2020 1552  PROCALCITON <0.10  LATICACIDVEN 1.3    Recent Results (from the past 240 hour(s))  SARS Coronavirus 2 by RT PCR (hospital order, performed in [facility] hospital lab) Nasopharyngeal Nasopharyngeal Swab     Status: Abnormal   Collection Time: 06/24/2020  3:52 PM   Specimen: Nasopharyngeal Swab  Result Value Ref Range Status   SARS Coronavirus 2 POSITIVE (A) NEGATIVE Final    Comment: RESULT CALLED TO, READ BACK BY AND VERIFIED WITH: Donnetta Hail RN 1720 07/11/2020 A BROWNING (NOTE) SARS-CoV-2 target nucleic acids are DETECTED  SARS-CoV-2 RNA is generally detectable in upper respiratory specimens  during the acute phase of infection.  Positive results are indicative  of the presence of the identified virus, but do not rule out bacterial infection or co-infection with other pathogens not detected by the test.  Clinical correlation with patient history and  other diagnostic information is necessary to determine patient infection status.  The expected result is negative.  Fact Sheet for Patients:   BoilerBrush.com.cy   Fact Sheet for Healthcare Providers:    https://pope.com/    This test is not yet approved or cleared by the [place] FDA and  has been authorized for detection and/or diagnosis of SARS-CoV-2 by FDA under an Emergency Use Authorization (EUA).  This EUA will remain in effect (meaning this t est can be used) for the duration of  the COVID-19 declaration under Section 564(b)(1) of the Act, 21 U.S.C. section 360-bbb-3(b)(1), unless the authorization is terminated or revoked sooner.  Performed at [facility], [address], [place], [place] [address]   Blood Culture (routine x 2)     Status: None   Collection Time: 06/30/2020  5:34 PM   Specimen: BLOOD  Result Value Ref Range Status   Specimen Description BLOOD LEFT ANTECUBITAL  Final   Special Requests   Final    BOTTLES DRAWN AEROBIC AND ANAEROBIC Blood Culture results may not be optimal due to an inadequate volume of blood received in culture bottles   Culture   Final    NO GROWTH 5 DAYS Performed at [facility], [address], [place], [place] [address]    Report Status 07/17/2020 FINAL  Final         Radiology Studies: No results found.      Scheduled Meds: . baricitinib  1 mg Oral Daily  . dexamethasone (DECADRON) injection  10 mg Intravenous Q12H  . enoxaparin (LOVENOX) injection  30 mg Subcutaneous Q24H  . hydrALAZINE  10 mg Intravenous Q8H  . insulin aspart  0-15 Units Subcutaneous TID WC  . [START ON 07/20/2020] levothyroxine  12.5 mcg Intravenous Daily  . sodium chloride flush  3 mL Intravenous Q12H  . sodium chloride flush  3 mL Intravenous Q12H   Continuous Infusions: . sodium chloride    . acetaminophen 1,000 mg (07/17/20 1325)     LOS: 5 days    Time spent: 35 minutes.     Alba Cory, MD [facility]   If 7PM-7AM, please contact night-coverage [email or web address]  07/17/2020, 2:47 PM

## 2020-07-17 NOTE — Progress Notes (Signed)
Inpatient Diabetes Program Recommendations  AACE/ADA: New Consensus Statement on Inpatient Glycemic Control (2015)  Target Ranges:  Prepandial:   less than 140 mg/dL      Peak postprandial:   less than 180 mg/dL (1-2 hours)      Critically ill patients:  140 - 180 mg/dL   Results for ANALYSSE, QUINONEZ (MRN [id number]) as of 07/17/2020 07:31  Ref. Range 07/16/2020 07:23 07/16/2020 12:03 07/16/2020 16:51 07/16/2020 20:48  Glucose-Capillary Latest Ref Range: 70 - 99 mg/dL 660 (H)  3 units NOVOLOG @9 :28am  180 (H)  3 units NOVOLOG  202 (H)  5 units NOVOLOG @6 :58pm  191 (H)     Diabetes history: Unknown  Outpatient Diabetes meds: None listed  Current Orders: Novolog Moderate Correction Scale/ SSI (0-15 units) TID AC     Getting Decadron 10 mg BID    MD- Note patient made NPO yesterday after swallow study completed.  Please consider changing the Novolog SSI to Q4 hour coverage while pt remains NPO    --Will follow patient during hospitalization--  RN, MSN, CDE Diabetes Coordinator Inpatient Glycemic Control Team Team Pager: [phone] (8a-5p)

## 2020-07-17 NOTE — Progress Notes (Signed)
Physical Therapy Treatment Patient Details Name: Kaitlyn Novak MRN: [id number] DOB: 10-May-1934 Today's Date: 07/17/2020    History of Present Illness 87 y.o. female with unclear past medical history (?thyroid disease) who presented to ED from [place] primary care due to respiratory distress and hypoxia. dx:  Covid+    PT Comments    Pt received in bed. SpO2 95% on 15L NRB. She required max assist bed mobility, sit to stand, and SPT. Desat to 80% during mobility. Quick return to 93% once seated in recliner. Pt required increased time and constant verbal cues to complete all functional mobility. She presents with lethargy and increased confusion. Pt in recliner with feet elevated at end of session. Discharge recommendation updated to SNF.    Follow Up Recommendations  SNF;Supervision/Assistance - 24 hour     Equipment Recommendations  Wheelchair cushion (measurements PT);Wheelchair (measurements PT);3in1 (PT);Other (comment) (home O2)    Recommendations for Other Services       Precautions / Restrictions Precautions Precautions: Fall;Other (comment) Precaution Comments: impaired cognition, watch sats    Mobility  Bed Mobility Overal bed mobility: Needs Assistance Bed Mobility: Supine to Sit     Supine to sit: HOB elevated;Max assist     General bed mobility comments: assist for all aspects of mobility,use of bed pad to scoot to EOB  Transfers Overall transfer level: Needs assistance Equipment used: 1 person hand held assist Transfers: Sit to/from Stand;Stand Pivot Transfers Sit to Stand: Max assist;From elevated surface Stand pivot transfers: Max assist       General transfer comment: assist to power up and stabilize balance, continual cues for sequencing. Pt able to take several, small pivot steps bed to recliner. Moving very slow.  Ambulation/Gait             General Gait Details: unable   Stairs             Wheelchair Mobility    Modified Rankin  (Stroke Patients Only)       Balance Overall balance assessment: Needs assistance Sitting-balance support: Feet supported;Single extremity supported Sitting balance-Leahy Scale: Poor Sitting balance - Comments: min assist to maintain balance EOB   Standing balance support: Bilateral upper extremity supported;During functional activity Standing balance-Leahy Scale: Poor Standing balance comment: reliant on external support                            Cognition Arousal/Alertness: Lethargic;Awake/alert Behavior During Therapy: Flat affect Overall Cognitive Status: No family/caregiver present to determine baseline cognitive functioning                                 General Comments: h/o cognitive impairment. Intermittently lethargic. Only oriented to name. Continuous cues to stay awake and stay on task. Slow processing. Following simple commands inconsistently and with increased time.      Exercises      General Comments General comments (skin integrity, edema, etc.): SpO2 95% on 15L NRB. Attempted to change to 15L HFNC, but pt began to desat into 80% at rest. Returned to and remained on 15L NRB for remainder of session. Desat to 80% during mobility. SpO2 93% at rest in recliner.      Pertinent Vitals/Pain Pain Assessment: Faces Faces Pain Scale: Hurts little more Pain Location: BLE Pain Descriptors / Indicators: Tender;Grimacing;Discomfort    Home Living  Prior Function            PT Goals (current goals can now be found in the care plan section) Acute Rehab PT Goals Patient Stated Goal: home per daughter Progress towards PT goals: Not progressing toward goals - comment (increased confusion and lethargy)    Frequency    Min 3X/week      PT Plan Discharge plan needs to be updated    Co-evaluation              AM-PAC PT "6 Clicks" Mobility   Outcome Measure  Help needed turning from your back to  your side while in a flat bed without using bedrails?: A Lot Help needed moving from lying on your back to sitting on the side of a flat bed without using bedrails?: A Lot Help needed moving to and from a bed to a chair (including a wheelchair)?: A Lot Help needed standing up from a chair using your arms (e.g., wheelchair or bedside chair)?: A Lot Help needed to walk in hospital room?: Total Help needed climbing 3-5 steps with a railing? : Total 6 Click Score: 10    End of Session Equipment Utilized During Treatment: Gait belt;Oxygen Activity Tolerance: Patient limited by lethargy Patient left: in chair;with call bell/phone within reach;with chair alarm set Nurse Communication: Mobility status PT Visit Diagnosis: Muscle weakness (generalized) (M62.81);Difficulty in walking, not elsewhere classified (R26.2)     Time: 2505-3976 PT Time Calculation (min) (ACUTE ONLY): 25 min  Charges:  $Therapeutic Activity: 23-37 mins                     [name], PT  Office # [phone] Pager [phone]    [name] 07/17/2020, 11:50 AM

## 2020-07-17 NOTE — Progress Notes (Signed)
We will arrange for an outpatient follow up and medication uptitration.   [facility] will sign off.   Medication Recommendations:  As currently prescribed. Other recommendations (labs, testing, etc):  No further inpatient testing Follow up as an outpatient:  We will arrange and call the patient.   Tobias Alexander, MD 07/17/2020

## 2020-07-17 NOTE — Progress Notes (Signed)
  Speech Language Pathology Treatment: Dysphagia  Patient Details Name: Kaitlyn Novak MRN: [id number] DOB: Jan 19, 1934 Today's Date: 07/17/2020 Time: 1497-0263 SLP Time Calculation (min) (ACUTE ONLY): 14 min  Assessment / Plan / Recommendation Clinical Impression  Pt was seen for dysphagia treatment. Verbal output was limited, but she exhibited frequent vocalization of "uhhh". Pt's SpO2 was 73% upon SLP's entry and NRB was laying on bed with only nasal cannula in place. NRB was replaced and pt's SpO2 improved to 96%. She demonstrated reduced bolus awareness with frequent blowing when ice chips and liquids were presented. No oral holding was demonstrated on this date and cueing was not required to swallow puree boluses. Pt inconsistently exhibited throat clearing with puree and thin liquids via tsp, suggesting possible laryngeal invasion. Pt exhibited difficulty maintaining alertness for extended periods and ultimately refused additional boluses. It is recommended that the pt's NPO status be maintained with allowance of ice chips following oral care. Pt's case was discussed with Dr. Sunnie Nielsen and she indicated that Cortrak placement will be deferred at this time since pt's daughter is considering transition to comfort care. SLP will continue to follow pt.    HPI HPI: 87 y.o. female with unclear past medical history (?thyroid disease) who presented to ED from [place] primary care due to respiratory distress and hypoxia. dx:  Covid+      SLP Plan  Continue with current plan of care       Recommendations  Diet recommendations: NPO (ice chips following oral care) Medication Administration: Via alternative means (if alternate means not available, critical meds may be given crushed in puree)                Oral Care Recommendations: Oral care QID Follow up Recommendations:  (TBD) Plan: Continue with current plan of care       Kaitlyn Novak I. Vear Clock, MS, CCC-SLP Acute Rehabilitation  Services Office number [phone] Pager [phone]                Scheryl Marten 07/17/2020, 3:45 PM

## 2020-07-17 NOTE — Progress Notes (Signed)
Patient's daughter, Rinaldo Cloud [phone], telephoned and updated.  Questions and concerns have been addressed.

## 2020-07-18 ENCOUNTER — Encounter (HOSPITAL_COMMUNITY): Payer: Self-pay | Admitting: Internal Medicine

## 2020-07-18 ENCOUNTER — Other Ambulatory Visit: Payer: Self-pay

## 2020-07-18 ENCOUNTER — Inpatient Hospital Stay (HOSPITAL_COMMUNITY): Payer: Medicare (Managed Care)

## 2020-07-18 DIAGNOSIS — J9601 Acute respiratory failure with hypoxia: Secondary | ICD-10-CM | POA: Diagnosis not present

## 2020-07-18 LAB — GLUCOSE, CAPILLARY
Glucose-Capillary: 248 mg/dL — ABNORMAL HIGH (ref 70–99)
Glucose-Capillary: 272 mg/dL — ABNORMAL HIGH (ref 70–99)
Glucose-Capillary: 212 mg/dL — ABNORMAL HIGH (ref 70–99)
Glucose-Capillary: 226 mg/dL — ABNORMAL HIGH (ref 70–99)

## 2020-07-18 LAB — BASIC METABOLIC PANEL
Anion gap: 16 — ABNORMAL HIGH (ref 5–15)
BUN: 25 mg/dL — ABNORMAL HIGH (ref 8–23)
CO2: 26 mmol/L (ref 22–32)
Calcium: 8.1 mg/dL — ABNORMAL LOW (ref 8.9–10.3)
Chloride: 98 mmol/L (ref 98–111)
Creatinine, Ser: 1.37 mg/dL — ABNORMAL HIGH (ref 0.44–1.00)
GFR, Estimated: 38 mL/min — ABNORMAL LOW (ref >60)
Glucose, Bld: 254 mg/dL — ABNORMAL HIGH (ref 70–99)
Potassium: 3.4 mmol/L — ABNORMAL LOW (ref 3.5–5.1)
Sodium: 140 mmol/L (ref 135–145)

## 2020-07-18 LAB — C-REACTIVE PROTEIN: CRP: 3.2 mg/dL — ABNORMAL HIGH (ref <1.0)

## 2020-07-18 MED ORDER — ACETAMINOPHEN 10 MG/ML IV SOLN
1000.0000 mg | Freq: Two times a day (BID) | INTRAVENOUS | Status: AC
  Administered 2020-07-18 (×2): 1000 mg via INTRAVENOUS
  Filled 2020-07-18 (×2): qty 100

## 2020-07-18 MED ORDER — INSULIN GLARGINE 100 UNIT/ML ~~LOC~~ SOLN
10.0000 [IU] | Freq: Every day | SUBCUTANEOUS | Status: DC
  Administered 2020-07-18 – 2020-07-20 (×3): 10 [IU] via SUBCUTANEOUS
  Filled 2020-07-18 (×4): qty 0.1

## 2020-07-18 MED ORDER — BISACODYL 10 MG RE SUPP
10.0000 mg | Freq: Once | RECTAL | Status: AC
  Administered 2020-07-18: 10 mg via RECTAL
  Filled 2020-07-18: qty 1

## 2020-07-18 MED ORDER — POTASSIUM CHLORIDE 10 MEQ/100ML IV SOLN
10.0000 meq | INTRAVENOUS | Status: AC
  Administered 2020-07-18 (×2): 10 meq via INTRAVENOUS
  Filled 2020-07-18 (×2): qty 100

## 2020-07-18 NOTE — Progress Notes (Signed)
Inpatient Diabetes Program Recommendations  AACE/ADA: New Consensus Statement on Inpatient Glycemic Control (2015)  Target Ranges:  Prepandial:   less than 140 mg/dL      Peak postprandial:   less than 180 mg/dL (1-2 hours)      Critically ill patients:  140 - 180 mg/dL   Results for HALIEGH, KHURANA (MRN [id number]) as of 07/18/2020 08:13  Ref. Range 07/17/2020 07:56 07/17/2020 11:51 07/17/2020 16:21 07/17/2020 21:06  Glucose-Capillary Latest Ref Range: 70 - 99 mg/dL 162 (H) 446 (H) 950 (H) 227 (H)   Results for FYNN, ADEL (MRN [id number]) as of 07/18/2020 08:13  Ref. Range 07/18/2020 08:00  Glucose-Capillary Latest Ref Range: 70 - 99 mg/dL 833 (H)   Diabetes history: Unknown  Outpatient Diabetes meds: None listed  Current Orders: Novolog Moderate Correction Scale/ SSI (0-15 units) TID AC     Getting Decadron 10 mg BID    MD- Note patient made NPO yesterday after swallow study completed.  If more aggressive CBG control desired, please consider:  1. Increasing the frequency of the Novolog SSI to Q4 hour coverage while pt remains NPO  2. Start Levemir 8 units Daily (0.1 units/kg)    --Will follow patient during hospitalization--  Ambrose Finland RN, MSN, CDE Diabetes Coordinator Inpatient Glycemic Control Team Team Pager: [phone] (8a-5p)

## 2020-07-18 NOTE — Progress Notes (Signed)
No bowel movement noted during shift.  Spoke to patient's daughter, Elita Quick.  Per Pam, patient's last BM was 1/24.

## 2020-07-18 NOTE — Progress Notes (Addendum)
PROGRESS NOTE    Kaitlyn Novak  BVQ:[id number] DOB: 08/14/1933 DOA: 06/29/2020 PCP: Patient, No Pcp Per   Brief Narrative: 87 year old unclear past medical history, thyroid disease, presents from [place] primary care due to respiratory distress and hypoxia.  Patient recently moved in with her daughter from [place] over the summer.  Patient currently only taking daily vitamins at this time.  She went to [facility] physician for evaluation of a slowly worsening shortness of breath over the last month  and establish care.  Patient was found to be hypoxic oxygen saturation in the 70s, increased work of breathing.  EMS was contacted, patient was a started on 15 L nonrebreather.  Still having worsening memory problems.  Evaluation in the ED: Patient oxygen saturation 90 on 6 L, creatinine 1.2, white blood cell 2.7, platelet 96, troponin 51.  Covid PCR positive.  Procalcitonin 0.1    Assessment & Plan:   Principal Problem:   Acute respiratory failure with hypoxia (HCC) Active Problems:   Volume overload   COVID-19 virus infection   Troponin level elevated   Creatinine elevation   1-Acute hypoxic respiratory failure, COVID-19 pneumonia, pulmonary edema volume overload -Received Remdesivir for 4 days.  -Continue with IV Decadron -Holding vitamin C and zinc, unable to tolerates oral.  -Continue to follow inflammatory markers. Markers trending down.   -Started Baricitinib 1/28. -Discussed with daughter, patient is DNR/DNI.  -Patient  is only requiring NBM, she was on HF and NBM   COVID-19 Labs  Recent Labs    07/16/20 0328 07/17/20 0319 07/18/20 0237  DDIMER 1.37* 1.38*  --   FERRITIN 592* 473*  --   CRP 5.3* 4.3* 3.2*    Lab Results  Component Value Date   SARSCOV2NAA POSITIVE (A) 06/23/2020   2-Acute systolic Heart failure exacerbation. Volume overload, lower extremity edema, pulmonary edema: Likely secondary  to heart failure Echo showed decreased Ef 35 %, wall motion  abnormalities.  Cardiology consulted.  Appreciate cardiology. Needs follow up out patient.  Holding lasix due to AKI.  Started on Hydralazine to help with BP.   3-Leukopenia; secondary to covid.  Resolved.   4-Mild elevation troponin. Chest pain free.  Related to covid vs heart failure.   5-Hypothyroidism;  TSH at 6. Started  synthroid.   6-Diabetes type 2;  HbA1c at 8.6 Continue with  SSI.  Start lantus 10 units daily.   AKI;  Cr peak to 1.8 Cr down to 1.5. --1.3 Continue to hold lasix.   Acute metabolic encephalopathy; Memory deficit;  Likely related to hypoxemia, covid viral illness, hospital delirium.  Became more lethargic night 1/29.  Continue to monitor.  Started  synthroid.  B12 elevated, not low.  Plan to continue NPO status. Speech eval.  She is alert, confuse this morning  PRN haldol for Agitation.  Will get CT head.   Hypokalemia; replete IV>  Generalized pain;  Will continue with IV tylenol two doses.  Constipation; dulcolax suppository ordered.   Palliative care consulted for goals of care.   Estimated body mass index is 34.83 kg/m as calculated from the following:   Height as of this encounter: 5' (1.524 m).   Weight as of this encounter: 80.9 kg.   DVT prophylaxis: Lovenox.  Code Status: Full code Family Communication; Daughter updated 1/30. 1/31., will update daughter later.  Disposition Plan:  Status is: Inpatient  Remains inpatient appropriate because:IV treatments appropriate due to intensity of illness or inability to take PO   Dispo: The patient is  from: Home              Anticipated d/c is to: Home              Anticipated d/c date is: 3 days              Patient currently is not medically stable to d/c.   Difficult to place patient No        Consultants:   None  Procedures:   ECHO ef 30--35 %  Antimicrobials:    Subjective: She is confuse, moan.  Alert.   Objective: Vitals:   07/17/20 2353 07/18/20 0500  07/18/20 0509 07/18/20 0815  BP: 127/65  (!) 146/89   Pulse: 94  98   Resp: 20  17   Temp: (!) 97.3 F (36.3 C)  (!) 97.4 F (36.3 C)   TempSrc: Axillary  Axillary   SpO2: 97%  95% 95%  Weight:  80.9 kg    Height:        Intake/Output Summary (Last 24 hours) at 07/18/2020 1406 Last data filed at 07/18/2020 0600 Gross per 24 hour  Intake 200.2 ml  Output 0 ml  Net 200.2 ml   Filed Weights   07/15/20 0400 07/16/20 0400 07/18/20 0500  Weight: 76.3 kg 82.3 kg 80.9 kg    Examination:  General exam: NAD.  Respiratory system: B/L crackles.  Cardiovascular system: S 1, S 2 RRR Gastrointestinal system: BS present, soft, nt Central nervous system: alert confuse Extremities: No edema    Data Reviewed: I have personally reviewed following labs and imaging studies  CBC: Recent Labs  Lab 07/13/20 1858 07/14/20 0253 07/15/20 0721 07/16/20 0328 07/17/20 0319  WBC 4.0 5.6 6.5 7.0 8.2  NEUTROABS 3.2 4.8 5.8 6.2 7.5  HGB 12.2 12.7 14.3 13.4 13.1  HCT 37.3 36.6 42.5 40.0 38.8  MCV 93.7 91.5 91.6 91.3 91.7  PLT 152 159 213 198 199   Basic Metabolic Panel: Recent Labs  Lab 07/13/20 0451 07/14/20 0253 07/15/20 0721 07/16/20 0328 07/17/20 0319 07/18/20 0237  NA 138 138 139 139 141 140  K 4.7 3.9 3.9 3.6 3.4* 3.4*  CL 102 95* 95* 95* 97* 98  CO2 24 25 28 31 30 26   GLUCOSE 191* 251* 237* 186* 189* 254*  BUN 15 22 26* 33* 29* 25*  CREATININE 1.43* 1.67* 1.63* 1.80* 1.54* 1.37*  CALCIUM 8.2* 8.2* 8.2* 7.9* 8.1* 8.1*  MG 2.0 1.9 1.9 1.9 2.0  --   PHOS 3.7 3.2 3.1 3.2 2.9  --    GFR: Estimated Creatinine Clearance: 28.8 mL/min (A) (by C-G formula based on SCr of 1.37 mg/dL (H)). Liver Function Tests: Recent Labs  Lab 07/13/20 0451 07/14/20 0253 07/15/20 0721 07/16/20 0328 07/17/20 0319  AST 57* 56* 57* 62* 54*  ALT 31 31 32 31 29  ALKPHOS 31* 41 47 52 58  BILITOT 1.3* 0.9 1.0 1.1 1.0  PROT 6.5 7.0 7.3 6.8 6.6  ALBUMIN 3.2* 3.3* 3.5 3.3* 3.2*   No results for  input(s): LIPASE, AMYLASE in the last 168 hours. No results for input(s): AMMONIA in the last 168 hours. Coagulation Profile: No results for input(s): INR, PROTIME in the last 168 hours. Cardiac Enzymes: No results for input(s): CKTOTAL, CKMB, CKMBINDEX, TROPONINI in the last 168 hours. BNP (last 3 results) No results for input(s): PROBNP in the last 8760 hours. HbA1C: No results for input(s): HGBA1C in the last 72 hours. CBG: Recent Labs  Lab 07/17/20 1151 07/17/20 1621  07/17/20 2106 07/18/20 0800 07/18/20 1241  GLUCAP 171* 234* 227* 248* 272*   Lipid Profile: No results for input(s): CHOL, HDL, LDLCALC, TRIG, CHOLHDL, LDLDIRECT in the last 72 hours. Thyroid Function Tests: No results for input(s): TSH, T4TOTAL, FREET4, T3FREE, THYROIDAB in the last 72 hours. Anemia Panel: Recent Labs    07/16/20 0328 07/17/20 0319  FERRITIN 592* 473*   Sepsis Labs: Recent Labs  Lab 06/17/2020 1552  PROCALCITON <0.10  LATICACIDVEN 1.3    Recent Results (from the past 240 hour(s))  SARS Coronavirus 2 by RT PCR (hospital order, performed in [facility] hospital lab) Nasopharyngeal Nasopharyngeal Swab     Status: Abnormal   Collection Time: 07/11/2020  3:52 PM   Specimen: Nasopharyngeal Swab  Result Value Ref Range Status   SARS Coronavirus 2 POSITIVE (A) NEGATIVE Final    Comment: RESULT CALLED TO, READ BACK BY AND VERIFIED WITH: Donnetta Hail RN 1720 06/26/2020 A BROWNING (NOTE) SARS-CoV-2 target nucleic acids are DETECTED  SARS-CoV-2 RNA is generally detectable in upper respiratory specimens  during the acute phase of infection.  Positive results are indicative  of the presence of the identified virus, but do not rule out bacterial infection or co-infection with other pathogens not detected by the test.  Clinical correlation with patient history and  other diagnostic information is necessary to determine patient infection status.  The expected result is negative.  Fact Sheet for  Patients:   BoilerBrush.com.cy   Fact Sheet for Healthcare Providers:   https://pope.com/    This test is not yet approved or cleared by the [place] FDA and  has been authorized for detection and/or diagnosis of SARS-CoV-2 by FDA under an Emergency Use Authorization (EUA).  This EUA will remain in effect (meaning this t est can be used) for the duration of  the COVID-19 declaration under Section 564(b)(1) of the Act, 21 U.S.C. section 360-bbb-3(b)(1), unless the authorization is terminated or revoked sooner.  Performed at [facility], [address] [place], [place] [address]   Blood Culture (routine x 2)     Status: None   Collection Time: 06/29/2020  5:34 PM   Specimen: BLOOD  Result Value Ref Range Status   Specimen Description BLOOD LEFT ANTECUBITAL  Final   Special Requests   Final    BOTTLES DRAWN AEROBIC AND ANAEROBIC Blood Culture results may not be optimal due to an inadequate volume of blood received in culture bottles   Culture   Final    NO GROWTH 5 DAYS Performed at [facility], [address], [place], [place] [address]    Report Status 07/17/2020 FINAL  Final         Radiology Studies: No results found.      Scheduled Meds: . baricitinib  1 mg Oral Daily  . dexamethasone (DECADRON) injection  10 mg Intravenous Q12H  . enoxaparin (LOVENOX) injection  30 mg Subcutaneous Q24H  . hydrALAZINE  10 mg Intravenous Q8H  . insulin aspart  0-15 Units Subcutaneous TID WC  . insulin glargine  10 Units Subcutaneous Daily  . [START ON 07/20/2020] levothyroxine  12.5 mcg Intravenous Daily  . sodium chloride flush  3 mL Intravenous Q12H  . sodium chloride flush  3 mL Intravenous Q12H   Continuous Infusions: . sodium chloride       LOS: 6 days    Time spent: 35 minutes.     Alba Cory, MD [facility]   If 7PM-7AM, please contact  night-coverage [email or web address]  07/18/2020, 2:06  PM

## 2020-07-18 NOTE — Plan of Care (Signed)
  Problem: Education: Goal: Knowledge of risk factors and measures for prevention of condition will improve Outcome: Progressing   Problem: Coping: Goal: Psychosocial and spiritual needs will be supported Outcome: Progressing   Problem: Respiratory: Goal: Will maintain a patent airway Outcome: Progressing Goal: Complications related to the disease process, condition or treatment will be avoided or minimized Outcome: Progressing   Problem: Safety: Goal: Non-violent Restraint(s) Outcome: Progressing   Problem: Safety: Goal: Non-violent Restraint(s) Outcome: Progressing   

## 2020-07-18 NOTE — Plan of Care (Signed)
°  Problem: Education: Goal: Knowledge of risk factors and measures for prevention of condition will improve Outcome: Progressing   Problem: Coping: Goal: Psychosocial and spiritual needs will be supported Outcome: Progressing   Problem: Respiratory: Goal: Will maintain a patent airway Outcome: Progressing Goal: Complications related to the disease process, condition or treatment will be avoided or minimized Outcome: Progressing   Problem: Safety: Goal: Non-violent Restraint(s) Outcome: Progressing   Problem: Safety: Goal: Non-violent Restraint(s) Outcome: Progressing   

## 2020-07-18 NOTE — Progress Notes (Signed)
  Speech Language Pathology Treatment: Dysphagia  Patient Details Name: Kaitlyn Novak MRN: [id number] DOB: Jul 31, 1933 Today's Date: 07/18/2020 Time: 9794-8016 SLP Time Calculation (min) (ACUTE ONLY): 12 min  Assessment / Plan / Recommendation Clinical Impression  Pt unfortunately does not demonstrate clinical improvement with regard to swallowing today. She remains on NRB/[place], maintaining Sp02 low 90s.  She responded to verbal cues to accept ice chips/sips of water, leading to active mastication, eventual swallow, followed by delayed throat-clearing.  She could not generate pressure to draw from a straw. Pt with audible standing secretions throughout session. Oral suctioning provided but secretions too deep to be reached by Renee Pain and pt had difficulty following commands to cough. Only limited sips/chips offered given her lethargy and concerns for airway protection.  Continue to provide frequent oral care and offer ice chips when pt is alert to encourage active swallowing/maintain integrity of oral mucosa.  Palliative medicine consult is pending - SLP will follow for GOC.    HPI HPI: 87 y.o. female with unclear past medical history (?thyroid disease) who presented to ED from [place] primary care due to respiratory distress and hypoxia. dx: Covid+      SLP Plan  Continue with current plan of care       Recommendations  Diet recommendations: NPO Medication Administration: Via alternative means                Oral Care Recommendations: Oral care QID SLP Visit Diagnosis: Dysphagia, unspecified (R13.10) Plan: Continue with current plan of care       GO                [name] 07/18/2020, 2:49 PM  Viggo Perko L. Samson Frederic, MA CCC/SLP Acute Rehabilitation Services Office number [phone] Pager [phone]

## 2020-07-18 DEATH — deceased

## 2020-07-19 DIAGNOSIS — I5021 Acute systolic (congestive) heart failure: Secondary | ICD-10-CM | POA: Diagnosis not present

## 2020-07-19 DIAGNOSIS — J9601 Acute respiratory failure with hypoxia: Secondary | ICD-10-CM | POA: Diagnosis not present

## 2020-07-19 DIAGNOSIS — G9340 Encephalopathy, unspecified: Secondary | ICD-10-CM | POA: Diagnosis not present

## 2020-07-19 DIAGNOSIS — Z7189 Other specified counseling: Secondary | ICD-10-CM

## 2020-07-19 DIAGNOSIS — Z515 Encounter for palliative care: Secondary | ICD-10-CM

## 2020-07-19 DIAGNOSIS — U071 COVID-19: Secondary | ICD-10-CM | POA: Diagnosis not present

## 2020-07-19 LAB — CREATININE, SERUM
Creatinine, Ser: 1.34 mg/dL — ABNORMAL HIGH (ref 0.44–1.00)
GFR, Estimated: 39 mL/min — ABNORMAL LOW (ref >60)

## 2020-07-19 LAB — GLUCOSE, CAPILLARY
Glucose-Capillary: 233 mg/dL — ABNORMAL HIGH (ref 70–99)
Glucose-Capillary: 199 mg/dL — ABNORMAL HIGH (ref 70–99)
Glucose-Capillary: 181 mg/dL — ABNORMAL HIGH (ref 70–99)
Glucose-Capillary: 196 mg/dL — ABNORMAL HIGH (ref 70–99)

## 2020-07-19 MED ORDER — FUROSEMIDE 10 MG/ML IJ SOLN
40.0000 mg | Freq: Once | INTRAMUSCULAR | Status: AC
  Administered 2020-07-19: 40 mg via INTRAVENOUS
  Filled 2020-07-19: qty 4

## 2020-07-19 MED ORDER — AMOXICILLIN-POT CLAVULANATE 500-125 MG PO TABS: 1.0000 | ORAL_TABLET | Freq: Two times a day (BID) | ORAL | Status: DC

## 2020-07-19 MED ORDER — AMOXICILLIN-POT CLAVULANATE 500-125 MG PO TABS
1.0000 | ORAL_TABLET | Freq: Two times a day (BID) | ORAL | Status: DC
  Administered 2020-07-19: 500 mg
  Filled 2020-07-19 (×2): qty 1

## 2020-07-19 NOTE — Consult Note (Signed)
Consultation Note Date: 07/19/2020   Patient Name: Kaitlyn Novak  DOB: 06-Aug-1933  MRN: [id number]  Age / Sex: 87 y.o., female  PCP: Patient, No Pcp Per Referring Physician: Albertine Grates, MD  Reason for Consultation: Establishing goals of care  HPI/Patient Profile: 87 y.o. female  with past medical history of thyroid disease, recently moved from [place] in August 2021 to live in [place] with her daughter admitted on 08-08-2020 from PCP appointment due to respiratory distress and hypoxia, oxygen in the 70's. Hospital admission for acute hypoxic respiratory failure secondary to COVID 19 pneumonia, pulmonary edema from acute systolic CHF with EF 35%, AKI on CKD, DM type II, acute metabolic encephalopathy with negative CT head, and overall failure to thrive. Patient's condition remains tenuous requiring 15L HFNC and 15L NRB mask. Remains encephalopathic with poor oral intake due to NPO recommendation. Palliative medicine consultation for goals of care.   Clinical Assessment and Goals of Care:  I have reviewed medical records, discussed with Dr. Roda Shutters and spoke with daughter Rinaldo Cloud) via telephone to discuss goals of care. This afternoon, patient remains on HFNC and 15L NRB mask. She is lethargic and will not follow commands.   I introduced Palliative Medicine as specialized medical care for people living with serious illness. It focuses on providing relief from the symptoms and stress of a serious illness. The goal is to improve quality of life for both the patient and the family.  The patient is not married. Rinaldo Cloud is her only child. She was living in an apartment in [place] until August 2021, when the patient fell and was down for ~4 days. She was hospitalized and Rinaldo Cloud received word of her mother's condition and that she would be placed in a nursing home. Rinaldo Cloud decided to move her mother down to [facility]. "Slowly but surely she came around."  Rinaldo Cloud shares her mother's quality of life has improved since around her and her 4 grandchildren and 3 great grandchildren.  Discussed events leading up to admission and course of hospitalization including diagnoses, interventions, plan of care, and concern with tenuous prognosis especially with high amounts of oxygen.   I attempted to elicit values and goals of care important to the patient and daughter. Advanced directives, concepts specific to code status, artifical feeding and hydration were discussed. Rinaldo Cloud reports she is her mother's documented POA. Rinaldo Cloud confirms her mother's wishes for DNR/DNI code status. Explored her thoughts on artificial nutrition. Rinaldo Cloud is interested in short-term tube feeding trial as she does not wish for her mother to just "wither away." Explained that this may not be a fix but we certainly could try a trial of tube feeds to see if her mother progresses.   Rinaldo Cloud shares "I don't want drastic measures to be taken but don't want anyone to give up." Rinaldo Cloud is trying to remain "optimistic" that her mother's condition will "turn around."   Reassured of ongoing support and updates from care team, especially if her mother's condition takes a turn for the worst. Briefly mentioned that is she  becomes uncomfortable or appears to be suffering despite high oxygen requirements and medical management, the medically recommendation will be consideration for comfort measures.   Answered questions. PMT contact information given.     SUMMARY OF RECOMMENDATIONS    GOC discussion with patient's surrogate decision-maker, daughter Cecile Sheerer).  DNR/DNI in the event of further decline. Otherwise, full scope treatment. Continue current plan of care and medical management.  Daughter interested in time trial of tube feeds. Discussed with Dr. Roda Shutters. Will need cortrak placement. Will need ongoing conversations regarding prolonged artificial nutrition if patient does not progress.   PMT  will continue to follow.   Code Status/Advance Care Planning:  DNR/DNI  Symptom Management:   Per attending  Palliative Prophylaxis:   Aspiration, Delirium Protocol, Frequent Pain Assessment, Oral Care and Turn Reposition  Psycho-social/Spiritual:   Desire for further Chaplaincy support: yes  Additional Recommendations: Caregiving  Support/Resources, Compassionate Wean Education and Education on Hospice  Prognosis:   Tenuous with acute respiratory failure secondary to COVID 19 pneumonia, systolic CHF with EF 35%, pulmonary edema, encephalopathy  Discharge Planning: To Be Determined      Primary Diagnoses: Present on Admission: . Acute respiratory failure with hypoxia (HCC)   I have reviewed the medical record, interviewed the patient and family, and examined the patient. The following aspects are pertinent.  Past Medical History:  Diagnosis Date  . Thyroid disease    Social History   Socioeconomic History  . Marital status: Single    Spouse name: Not on file  . Number of children: Not on file  . Years of education: Not on file  . Highest education level: Not on file  Occupational History  . Not on file  Tobacco Use  . Smoking status: Never Smoker  . Smokeless tobacco: Never Used  Substance and Sexual Activity  . Alcohol use: Not on file  . Drug use: Not on file  . Sexual activity: Not on file  Other Topics Concern  . Not on file  Social History Narrative  . Not on file   Social Determinants of Health   Financial Resource Strain: Not on file  Food Insecurity: Not on file  Transportation Needs: Not on file  Physical Activity: Not on file  Stress: Not on file  Social Connections: Not on file   Family History  Problem Relation Age of Onset  . Lung disease Father    Scheduled Meds: . amoxicillin-clavulanate  1 tablet Per Tube BID  . baricitinib  1 mg Oral Daily  . dexamethasone (DECADRON) injection  10 mg Intravenous Q12H  . enoxaparin  (LOVENOX) injection  30 mg Subcutaneous Q24H  . hydrALAZINE  10 mg Intravenous Q8H  . insulin aspart  0-15 Units Subcutaneous TID WC  . insulin glargine  10 Units Subcutaneous Daily  . [START ON 07/20/2020] levothyroxine  12.5 mcg Intravenous Daily  . sodium chloride flush  3 mL Intravenous Q12H  . sodium chloride flush  3 mL Intravenous Q12H   Continuous Infusions: . sodium chloride     PRN Meds:.sodium chloride, chlorpheniramine-HYDROcodone, guaiFENesin-dextromethorphan, haloperidol lactate, lip balm, polyethylene glycol, sodium chloride flush Medications Prior to Admission:  Prior to Admission medications   Medication Sig Start Date End Date Taking? Authorizing Provider  Multiple Vitamin (MULTIVITAMIN ADULT) TABS Take 1 tablet by mouth daily.   Yes [provider]   No Known Allergies Review of Systems  Unable to perform ROS: Acuity of condition   Physical Exam  Vital Signs: BP 140/85 (BP  Location: Right Arm)   Pulse 91   Temp 98 F (36.7 C) (Oral)   Resp 18   Ht 5' (1.524 m)   Wt 80.9 kg   SpO2 (!) 85%   BMI 34.83 kg/m  Pain Scale: PAINAD POSS *See Group Information*: 1-Acceptable,Awake and alert Pain Score: 0-No pain   SpO2: SpO2: (!) 85 % O2 Device:SpO2: (!) 85 % O2 Flow Rate: .O2 Flow Rate (L/min): 15 L/min (15L NRB)  IO: Intake/output summary:   Intake/Output Summary (Last 24 hours) at 07/19/2020 1805 Last data filed at 07/19/2020 1716 Gross per 24 hour  Intake 0 ml  Output 100 ml  Net -100 ml    LBM: Last BM Date: 07/10/20 Baseline Weight: Weight: 75.1 kg Most recent weight: Weight: 80.9 kg     Palliative Assessment/Data: PPS 20%   Flowsheet Rows   Flowsheet Row Most Recent Value  Intake Tab   Referral Department Hospitalist  Unit at Time of Referral Cardiac/Telemetry Unit  Palliative Care Primary Diagnosis Sepsis/Infectious Disease  Palliative Care Type New Palliative care  Reason for referral Clarify Goals of Care  Date first seen by  Palliative Care 07/19/20  Clinical Assessment   Palliative Performance Scale Score 20%  Psychosocial & Spiritual Assessment   Palliative Care Outcomes   Patient/Family meeting held? Yes  Who was at the meeting? daughter Rinaldo Cloud)  Palliative Care Outcomes Clarified goals of care, Provided psychosocial or spiritual support, ACP counseling assistance, Provided end of life care assistance      Time Total: 60 Greater than 50%  of this time was spent counseling and coordinating care related to the above assessment and plan.  The above conversation was completed via telephone due to visitor restrictions during COVID-19 pandemic. Thorough chart review and discussion with multidisciplinary team was completed as part of assessment. No physical examination was performed.   Signed by:  Vennie Homans, DNP, FNP-C Palliative Medicine Team  Phone: [phone] Fax: [phone]   Please contact Palliative Medicine Team phone at [phone] for questions and concerns.  For individual provider: See Loretha Stapler

## 2020-07-19 NOTE — Progress Notes (Signed)
  Speech Language Pathology Treatment: Dysphagia  Patient Details Name: Kaitlyn Novak MRN: [id number] DOB: 04/26/1934 Today's Date: 07/19/2020 Time: 9628-3662 SLP Time Calculation (min) (ACUTE ONLY): 14 min  Assessment / Plan / Recommendation Clinical Impression  SLP participated in co-treatment session with PT and OT to optimize pt's alertness and participation in the session. Pt was repositioned and seated upright. Pt was lethargic and sats were 83-89% during the session with decline during repositioning upright. Pt's sats improved to the 90s when reclined. RR was <20 throughout the session. Oral care was provided and some decline in SpO2 noted. Pt did not demonstrate a volitional swallow with trace liquids despite verbal and tactile prompts. Additional trials were deferred considering her lethargy and respiratory status despite nasal cannula and NRB in place. Significant progress has not been demonstrated and she still does not present as a candidate for safe intake of meals. It is recommended that the pt's NPO status be maintained with frequent oral care and allowance of ice chips thereafter if she is adequately alert. SLP will continue to follow pt.    HPI HPI: 87 y.o. female with unclear past medical history (?thyroid disease) who presented to ED from [facility] primary care due to respiratory distress and hypoxia. dx: Covid+      SLP Plan  Continue with current plan of care       Recommendations  Diet recommendations: NPO Medication Administration: Via alternative means                Oral Care Recommendations: Oral care QID SLP Visit Diagnosis: Dysphagia, unspecified (R13.10) Plan: Continue with current plan of care       Kaitlyn Novak I. Kaitlyn Clock, MS, CCC-SLP Acute Rehabilitation Services Office number [phone] Pager [phone]                 Kaitlyn Novak 07/19/2020, 12:08 PM

## 2020-07-19 NOTE — Progress Notes (Signed)
Occupational Therapy Treatment Patient Details Name: Kaitlyn Novak MRN: [id number] DOB: [birth year] Today's Date: 07/19/2020    History of present illness Pt is 87 y.o. female with unclear past medical history (?thyroid disease) who presented to ED from [facility] due to respiratory distress and hypoxia. dx:  Covid+.  Change in mental status 07/16/20 due to increasing 02 requirements.   OT comments  Pt seen in conjunction with PT.  She was minimally responsive during session. She did not follow commands.  She did visually fixate on therapist inconsistently, and tracked x 1.  She moaned to pain, but did not withdraw or localize to pain.  She was unable to engage in ADL tasks or functional mobility this date.  HOB was elevated to ~50-55* and pt assisted into unsupported long sitting with Sp02 decreasing to 84-87% on 15L HiFLO 02 +NRB.   Sp02 improved to mid to low 90s once pt returned to supported upright posture in the bed and bed height lowered to 45*.  At this time, pt is not progressing toward goals and has had a decline in function.  If pt does not demonstrate significant improvement in her ability to participate next session, will decrease frequency and downgrade goals.   Follow Up Recommendations  SNF;Supervision/Assistance - 24 hour    Equipment Recommendations  None recommended by OT    Recommendations for Other Services      Precautions / Restrictions Precautions Precautions: Fall;Other (comment) (high 02 requirements) Precaution Comments: impaired cognition, watch sats       Mobility Bed Mobility Overal bed mobility: Needs Assistance             General bed mobility comments: Pt with poor resp status and unable to tolerate sitting EOB.  Did raise HOB to 50 degrees and assisted with long sitting - max A with Sp02 84-87% with pt on 15L [place] 02 + NRB.  Transfers                 General transfer comment: unable this date    Balance Overall balance  assessment: Needs assistance   Sitting balance-Leahy Scale: Zero Sitting balance - Comments: unable                                   ADL either performed or assessed with clinical judgement   ADL Overall ADL's : Needs assistance/impaired                                       General ADL Comments: total A for all aspects     Vision       Perception     Praxis      Cognition Arousal/Alertness: Lethargic Behavior During Therapy: Flat affect Overall Cognitive Status: Impaired/Different from baseline                                 General Comments: Pt minimally responsive.  She will occasionally open eyes and fixate on therapist.  She does not follow commands even with max stimuli.  She will moan to painful stimulation, but does not withdraw or localize. she did track therapist x 1 at end of session        Exercises General Exercises - Lower Extremity Ankle Circles/Pumps: PROM;Both;5 reps;Supine Heel  Slides: PROM;Both;5 reps;Supine (Pt did perform spontaneuous leg movements at times) Other Exercises Other Exercises: heel cord stretch bil LE 15 sec x 3   Shoulder Instructions       General Comments Pt on 15 L HFNC and 15 L NRB.  At arrival pt had removed NRB and sats were 79% and pt with secreations down her jaw and neck.  Assisted with suctioning mouth, cleaning, and replaced NRB - sats coming up to mid 90's within 1-2 mins.  Once pt recovered, raised HOB to 50 degrees and assist with long sitting, trying to increase level of alertness, and speech in for oral care.  Pt sats dropping as low as 84% during theses activities but tended to stay around 87%.  Once pt relaxed back and lowered to 45 degrees sats improving to 94% with rest.  Pt more alert and with improved tracking/acknowledgement of therapist post treatment. Her HR remained in the 80's    Pertinent Vitals/ Pain       Pain Assessment: Faces Faces Pain Scale: No  hurt  Home Living                                          Prior Functioning/Environment              Frequency  Min 2X/week        Progress Toward Goals  OT Goals(current goals can now be found in the care plan section)  Progress towards OT goals: Progressing toward goals;Not progressing toward goals - comment  Acute Rehab OT Goals Patient Stated Goal: home per daughter per chart  Plan Discharge plan needs to be updated    Co-evaluation    PT/OT/SLP Co-Evaluation/Treatment: Yes Reason for Co-Treatment: Complexity of the patient's impairments (multi-system involvement);Necessary to address cognition/behavior during functional activity;For patient/therapist safety;To address functional/ADL transfers PT goals addressed during session: Mobility/safety with mobility OT goals addressed during session: ADL's and self-care;Strengthening/ROM      AM-PAC OT "6 Clicks" Daily Activity     Outcome Measure   Help from another person eating meals?: Total Help from another person taking care of personal grooming?: Total Help from another person toileting, which includes using toliet, bedpan, or urinal?: Total Help from another person bathing (including washing, rinsing, drying)?: Total Help from another person to put on and taking off regular upper body clothing?: Total Help from another person to put on and taking off regular lower body clothing?: Total 6 Click Score: 6    End of Session Equipment Utilized During Treatment: Oxygen  OT Visit Diagnosis: Unsteadiness on feet (R26.81);Cognitive communication deficit (R41.841)   Activity Tolerance Patient limited by lethargy   Patient Left in bed;with call bell/phone within reach;with bed alarm set   Nurse Communication Mobility status        Time: 0962-8366 OT Time Calculation (min): 32 min  Charges: OT General Charges $OT Visit: 1 Visit OT Treatments $Therapeutic Activity: 8-22 mins  Eber Jones.,  OTR/L Acute Rehabilitation Services Pager [phone] Office [phone]    Jeani Hawking M 07/19/2020, 2:30 PM

## 2020-07-19 NOTE — Procedures (Signed)
Cortrak  Person Inserting Tube:  [name], RD Tube Type:  Cortrak - 43 inches Tube Location:  Right nare Initial Placement:  Stomach Secured by: Bridle Technique Used to Measure Tube Placement:  Documented cm marking at nare/ corner of mouth Cortrak Secured At:  71 cm    Cortrak Tube Team Note:  Consult received to place a Cortrak feeding tube.   No x-ray is required. RN may begin using tube.    If the tube becomes dislodged please keep the tube and contact the Cortrak team at [email or web address] (password [email or web address]) for replacement.  If after hours and replacement cannot be delayed, place a NG tube and confirm placement with an abdominal x-ray.    Kaitlyn Novak P., RD, LDN, CNSC See AMiON for contact information    

## 2020-07-19 NOTE — Progress Notes (Signed)
Initial Nutrition Assessment  DOCUMENTATION CODES:   Obesity unspecified  INTERVENTION:  Monitor magnesium, potassium, and phosphorus daily for at least 3 days, MD to replete as needed, as pt is at risk for refeeding syndrome.  Begin TF via Cortrak: -Osmolite 1.2 @ 29m/hr, advance 150mhr Q8H until goal rate of 6583mr (1560m39ms reached  -45ml52msource TF daily -200ml 61m water Q8H   At goal, TF rate will provide 1912 kcals, 97 grams of protein, 1279ml f72mwater (1879ml to7mfree water with flushes)   NUTRITION DIAGNOSIS:   Inadequate oral intake related to inability to eat as evidenced by NPO status.    GOAL:   Patient will meet greater than or equal to 90% of their needs    MONITOR:   TF tolerance,Weight trends,Labs,I & O's  REASON FOR ASSESSMENT:   Consult,Malnutrition Screening Tool Enteral/tube feeding initiation and management  ASSESSMENT:   Pt admitted with acute hypoxic respiratory failure 2/2 COVID-19 PNA and pulmonary edema 2/2 CHF. PMH includes thyroid disease.  Pt is very lethargic and only briefly opens eyes to voice. Due to pt's lethargy and metabolic encephalopathy, pt has been NPO and/or had poor po intake since admit (on Heart Healthy/Carb Modified diet 1/26-1/30, no intake documented). PMT met with pt and daughter today to establish GOC. [place], pt's condition is tenuous with pt requiring 15L HFNC and 15L NRB mask. Pt's daughter requested short-term trial of tube feeding. Cortrak placed today, tip in stomach.   Reviewed weight history. No significant weight changes noted.   UOP: 100ml doc20mted today, 2x unmeasured occurrences    Labs:K+ 3.4 (L), CBGs [phone]: decadron, ss novolog TID w/ meals, 10 units lantus daily  NUTRITION - FOCUSED PHYSICAL EXAM:  Unable to perform at this time. Will attempt at follow-up.   Diet Order:   Diet Order            Diet NPO time specified Except for: Ice Chips  Diet effective now                  EDUCATION NEEDS:   No education needs have been identified at this time  Skin:  Skin Assessment: Reviewed RN Assessment  Last BM:  1/24  Height:   Ht Readings from Last 1 Encounters:  07/14/20 5' (1.524 m)    Weight:   Wt Readings from Last 1 Encounters:  07/20/20 81 kg    BMI:  Body mass index is 34.88 kg/m.  Estimated Nutritional Needs:   Kcal:  1800-2000  Protein:  90-105 grams  Fluid:  >1.8L    Gurkaran Rahm AvLarkin Ina LDN RD pager number and weekend/on-call pager number located in Amion.[place]

## 2020-07-19 NOTE — Progress Notes (Signed)
PROGRESS NOTE    Kaitlyn Novak  MMH:[id number] DOB: [birth year] DOA: 06/26/2020 PCP: Patient, No Pcp Per    No chief complaint on file.   Brief Narrative:  87 year old unclear past medical history, thyroid disease, Patient recently moved in with her daughter from [place] over the summer.  Patient currently only taking daily vitamins at this time She presents from [place] [facility] due to respiratory distress and hypoxia.   Patient was found to be hypoxic oxygen saturation in the 70s, increased work of breathing.  EMS was contacted, patient was a started on 15 L nonrebreather. Evaluation in the ED: Patient oxygen saturation 90 on 6 L, creatinine 1.2, white blood cell 2.7, platelet 96, troponin 51.  Covid PCR positive.  Procalcitonin 0.1    Subjective:  She is very lethargic, only open eyes to voice briefly, she does not follow command She is currently on nonrebreather, with intermittent accessory muscle use  Assessment & Plan:   Principal Problem:   Acute respiratory failure with hypoxia (HCC) Active Problems:   Volume overload   COVID-19 virus infection   Troponin level elevated   Creatinine elevation   Acute hypoxic respiratory failure, Covid 19 pneumonia, pulmonary edema from heart failure -Continue to have severe hypoxia, requiring nonrebreather  COVID-19 pneumonia Received remdesivir, on IV Decadron, started on baricitinib January 28 -CRP and D-dimer appear trending down, however continue require high amount of oxygen  Acute systolic heart failure exacerbation -Previous documented patient volume overloaded with lower extremity edema and pulmonary edema -Left ventricular EF 35%, cardiology consulted recommend medical management and outpatient follow-up -She received Lasix from January 26 to January 29, Lasix held due to AKI -Creatinine appear improved ,will give another dose of Lasix today,   Mild elevation of troponin -No chest pain -Likely related to Covid versus  heart failure -Cardiology consulted recommend outpatient follow-up  AKI on CKD 3B -Creatinine peaked at 1.8, improved today 1.3 -Continue renal dosing meds  Diabetes, type II -A1c 8.6 -Apparently not on current medication at home -Currently on Lantus 10 units daily and SSI  Hypothyroidism TSH 6, started on Synthroid  Acute metabolic encephalopathy CT head no acute findings, Nonspecific right greater than left mastoid and middle ear Opacification Start trial of augmentin per tube  FTT, poor prognosis Confirmed DNR status -family discussed with palliative care who wanted trial of tube feeds, core track and tube order placed   DVT prophylaxis: enoxaparin (LOVENOX) injection 30 mg Start: 07/14/20 2030   Code Status: Family Communication: Daughter over the phone Disposition:   Status is: Inpatient   Dispo: The patient is from: home              Anticipated d/c is to: TBD              Anticipated d/c date is: TBD              Patient currently not medically ready to discharge, remain significant hypoxia, on nonrebreather  Consultants:   Cardiology, has signed off  Palliative care  Procedures:   Core track feeding tube placement on thyroid the second  Antimicrobials:   Augmentin from February 2     Objective: Vitals:   07/19/20 0454 07/19/20 0731 07/19/20 1311 07/19/20 1715  BP: 132/74 (!) 147/80 131/78 140/85  Pulse: 83 93 88 91  Resp: 16 20 13 18   Temp: (!) 97.1 F (36.2 C) (!) 97 F (36.1 C) (!) 97.5 F (36.4 C) 98 F (36.7 C)  TempSrc: Axillary  Oral Oral Oral  SpO2: 91% 91% 93% (!) 85%  Weight:      Height:        Intake/Output Summary (Last 24 hours) at 07/19/2020 1730 Last data filed at 07/19/2020 1716 Gross per 24 hour  Intake 0 ml  Output 100 ml  Net -100 ml   Filed Weights   07/16/20 0400 07/18/20 0500 07/19/20 0448  Weight: 82.3 kg 80.9 kg 80.9 kg    Examination:  General exam: Very lethargic, open eyes to voice  briefly Respiratory system: Diminished, but no wheezing, no rales no rhonchi.  Intermittent accessory muscle use Cardiovascular system: S1 & S2 heard, RRR.  Gastrointestinal system: Abdomen is nondistended, soft and nontender. Normal bowel sounds heard. Central nervous system: Very lethargic Extremities: Generalized weakness, trace edema bilateral lower extremity Skin: No rashes, lesions or ulcers Psychiatry: Very lethargic    Data Reviewed: I have personally reviewed following labs and imaging studies  CBC: Recent Labs  Lab 07/13/20 1858 07/14/20 0253 07/15/20 0721 07/16/20 0328 07/17/20 0319  WBC 4.0 5.6 6.5 7.0 8.2  NEUTROABS 3.2 4.8 5.8 6.2 7.5  HGB 12.2 12.7 14.3 13.4 13.1  HCT 37.3 36.6 42.5 40.0 38.8  MCV 93.7 91.5 91.6 91.3 91.7  PLT 152 159 213 198 199    Basic Metabolic Panel: Recent Labs  Lab 07/13/20 0451 07/14/20 0253 07/15/20 0721 07/16/20 0328 07/17/20 0319 07/18/20 0237 07/19/20 0500  NA 138 138 139 139 141 140  --   K 4.7 3.9 3.9 3.6 3.4* 3.4*  --   CL 102 95* 95* 95* 97* 98  --   CO2 24 25 28 31 30 26   --   GLUCOSE 191* 251* 237* 186* 189* 254*  --   BUN 15 22 26* 33* 29* 25*  --   CREATININE 1.43* 1.67* 1.63* 1.80* 1.54* 1.37* 1.34*  CALCIUM 8.2* 8.2* 8.2* 7.9* 8.1* 8.1*  --   MG 2.0 1.9 1.9 1.9 2.0  --   --   PHOS 3.7 3.2 3.1 3.2 2.9  --   --     GFR: Estimated Creatinine Clearance: 29.5 mL/min (A) (by C-G formula based on SCr of 1.34 mg/dL (H)).  Liver Function Tests: Recent Labs  Lab 07/13/20 0451 07/14/20 0253 07/15/20 0721 07/16/20 0328 07/17/20 0319  AST 57* 56* 57* 62* 54*  ALT 31 31 32 31 29  ALKPHOS 31* 41 47 52 58  BILITOT 1.3* 0.9 1.0 1.1 1.0  PROT 6.5 7.0 7.3 6.8 6.6  ALBUMIN 3.2* 3.3* 3.5 3.3* 3.2*    CBG: Recent Labs  Lab 07/18/20 1241 07/18/20 1725 07/18/20 2001 07/19/20 0736 07/19/20 1309  GLUCAP 272* 212* 226* 233* 199*     Recent Results (from the past 240 hour(s))  SARS Coronavirus 2 by RT PCR  (hospital order, performed in [facility] hospital lab) Nasopharyngeal Nasopharyngeal Swab     Status: Abnormal   Collection Time: 07/19/2020  3:52 PM   Specimen: Nasopharyngeal Swab  Result Value Ref Range Status   SARS Coronavirus 2 POSITIVE (A) NEGATIVE Final    Comment: RESULT CALLED TO, READ BACK BY AND VERIFIED WITH: 07/14/20 RN 1720 07-19-2020 A BROWNING (NOTE) SARS-CoV-2 target nucleic acids are DETECTED  SARS-CoV-2 RNA is generally detectable in upper respiratory specimens  during the acute phase of infection.  Positive results are indicative  of the presence of the identified virus, but do not rule out bacterial infection or co-infection with other pathogens not detected by the test.  Clinical  correlation with patient history and  other diagnostic information is necessary to determine patient infection status.  The expected result is negative.  Fact Sheet for Patients:   BoilerBrush.com.cy   Fact Sheet for Healthcare Providers:   https://pope.com/    This test is not yet approved or cleared by the [place] FDA and  has been authorized for detection and/or diagnosis of SARS-CoV-2 by FDA under an Emergency Use Authorization (EUA).  This EUA will remain in effect (meaning this t est can be used) for the duration of  the COVID-19 declaration under Section 564(b)(1) of the Act, 21 U.S.C. section 360-bbb-3(b)(1), unless the authorization is terminated or revoked sooner.  Performed at [facility], [address], [place], [place] [address]   Blood Culture (routine x 2)     Status: None   Collection Time: 06/24/2020  5:34 PM   Specimen: BLOOD  Result Value Ref Range Status   Specimen Description BLOOD LEFT ANTECUBITAL  Final   Special Requests   Final    BOTTLES DRAWN AEROBIC AND ANAEROBIC Blood Culture results may not be optimal due to an inadequate volume of blood received in culture bottles   Culture   Final    NO  GROWTH 5 DAYS Performed at [facility], [address], [place], [place] [address]    Report Status 07/17/2020 FINAL  Final         Radiology Studies: CT HEAD WO CONTRAST  Result Date: 07/18/2020 CLINICAL DATA:  Delirium EXAM: CT HEAD WITHOUT CONTRAST TECHNIQUE: Contiguous axial images were obtained from the base of the skull through the vertex without intravenous contrast. COMPARISON:  None. FINDINGS: Brain: There is no acute intracranial hemorrhage, mass effect, or edema. Gray-white differentiation is preserved. There is no extra-axial fluid collection. Ventricles and sulci are within normal limits in size and configuration. Vascular: There is atherosclerotic calcification at the skull base. Skull: Calvarium is unremarkable. Sinuses/Orbits: Mild mucosal thickening. Other: Right greater than left mastoid opacification. Right middle ear opacification. IMPRESSION: No acute intracranial abnormality. Nonspecific right greater than left mastoid and middle ear opacification. Electronically Signed   By: Guadlupe Spanish M.D.   On: 07/18/2020 19:53        Scheduled Meds: . baricitinib  1 mg Oral Daily  . dexamethasone (DECADRON) injection  10 mg Intravenous Q12H  . enoxaparin (LOVENOX) injection  30 mg Subcutaneous Q24H  . hydrALAZINE  10 mg Intravenous Q8H  . insulin aspart  0-15 Units Subcutaneous TID WC  . insulin glargine  10 Units Subcutaneous Daily  . [START ON 07/20/2020] levothyroxine  12.5 mcg Intravenous Daily  . sodium chloride flush  3 mL Intravenous Q12H  . sodium chloride flush  3 mL Intravenous Q12H   Continuous Infusions: . sodium chloride       LOS: 7 days   Time spent: 35 mins, case discussed with palliative care Greater than 50% of this time was spent in counseling, explanation of diagnosis, planning of further management, and coordination of care.  I have personally reviewed and interpreted on  07/19/2020 daily labs, tele strips, imagings as discussed above under  date review session and assessment and plans.  I reviewed all nursing notes, pharmacy notes, consultant notes,  vitals, pertinent old records  I have discussed plan of care as described above with RN , patient and family on 07/19/2020  Voice Recognition /Dragon dictation system was used to create this note, attempts have been made to correct errors. Please contact the  author with questions and/or clarifications.   Albertine Grates, MD PhD FACP [facility]  Available via Epic secure chat 7am-7pm for nonurgent issues Please page for urgent issues To page the attending provider between 7A-7P or the covering provider during after hours 7P-7A, please log into the web site [email or web address] and access using universal [place] password for that web site. If you do not have the password, please call the hospital operator.    07/19/2020, 5:30 PM

## 2020-07-19 NOTE — Progress Notes (Signed)
Physical Therapy Treatment Patient Details Name: Kaitlyn Novak MRN: [id number] DOB: 12/13/1933 Today's Date: 07/19/2020    History of Present Illness Pt is 87 y.o. female with unclear past medical history (?thyroid disease) who presented to ED from [place] primary care due to respiratory distress and hypoxia. dx:  Covid+    PT Comments    Pt with very low tolerance for activity and little to no ability to participate with therapy.  At arrival pt had removed her NRB and sats 79%, replaced NRB allowed pt to recover, and performed limited treatment due to inability to participate and to tolerate activity.  O2 sats dropping even with long sitting from HOB at 50 degrees (see general comments). After session, lowered HOB back to ~40 degrees.  Pt was more alert and with improved tracking of therapist (looking between PT and OT) post treatment.   Did note pt has palliative consult and is DNR.    Follow Up Recommendations  SNF;Supervision/Assistance - 24 hour     Equipment Recommendations  Wheelchair cushion (measurements PT);Wheelchair (measurements PT);3in1 (PT);Other (comment);Hospital bed (O2, hoyer)    Recommendations for Other Services       Precautions / Restrictions Precautions Precautions: Fall;Other (comment) Precaution Comments: impaired cognition, watch sats    Mobility  Bed Mobility Overal bed mobility: Needs Assistance             General bed mobility comments: Pt with poor resp status and unable to tolerate sitting EOB.  Did raise HOB to 50 degrees and assisted with long sitting - max A .  Transfers                    Ambulation/Gait                 Stairs             Wheelchair Mobility    Modified Rankin (Stroke Patients Only)       Balance Overall balance assessment: Needs assistance     Sitting balance - Comments: unable                                    Cognition Arousal/Alertness: Lethargic Behavior During  Therapy: Flat affect Overall Cognitive Status: No family/caregiver present to determine baseline cognitive functioning                                 General Comments: No caregiver present and pt with hx of cognitive impairment; however, very limited today.  She performed inconsistent tracking with eyes, no verbalization, and not able to follow any commands.  Pt with limited response to noxious stimuli (minimal moan) but did not withdrawl or grimace      Exercises General Exercises - Lower Extremity Ankle Circles/Pumps: PROM;Both;5 reps;Supine Heel Slides: PROM;Both;5 reps;Supine (Pt did perform spontaneuous leg movements at times) Other Exercises Other Exercises: heel cord stretch bil LE 15 sec x 3    General Comments General comments (skin integrity, edema, etc.): Pt on 15 L HFNC and 15 L NRB.  At arrival pt had removed NRB and sats were 79% and pt with secreations down her jaw and neck.  Assisted with suctioning mouth, cleaning, and replaced NRB - sats coming up to mid 90's within 1-2 mins.  Once pt recovered, raised HOB to 50 degrees and assist with long sitting, trying to  increase level of alertness, and speech in for oral care.  Pt sats dropping as low as 84% during theses activities but tended to stay around 87%.  Once pt relaxed back and lowered to 45 degrees sats improving to 94% with rest.  Pt more alert and with improved tracking/acknowledgement of therapist post treatment. Her HR remained in the 80's      Pertinent Vitals/Pain Pain Assessment: No/denies pain Faces Pain Scale: No hurt    Home Living                      Prior Function            PT Goals (current goals can now be found in the care plan section) Acute Rehab PT Goals Patient Stated Goal: home per daughter per chart PT Goal Formulation: Patient unable to participate in goal setting Time For Goal Achievement: 07/28/20 Potential to Achieve Goals: Poor Progress towards PT goals: Not  progressing toward goals - comment (declining resp status/alertness)    Frequency    Min 2X/week      PT Plan Frequency needs to be updated    Co-evaluation PT/OT/SLP Co-Evaluation/Treatment: Yes Reason for Co-Treatment: Complexity of the patient's impairments (multi-system involvement);For patient/therapist safety PT goals addressed during session: Mobility/safety with mobility OT goals addressed during session: ADL's and self-care      AM-PAC PT "6 Clicks" Mobility   Outcome Measure  Help needed turning from your back to your side while in a flat bed without using bedrails?: Total Help needed moving from lying on your back to sitting on the side of a flat bed without using bedrails?: Total Help needed moving to and from a bed to a chair (including a wheelchair)?: Total Help needed standing up from a chair using your arms (e.g., wheelchair or bedside chair)?: Total Help needed to walk in hospital room?: Total Help needed climbing 3-5 steps with a railing? : Total 6 Click Score: 6    End of Session Equipment Utilized During Treatment: Oxygen Activity Tolerance: Patient limited by lethargy Patient left: in bed;with call bell/phone within reach;with bed alarm set Nurse Communication: Mobility status (resp status; limited participation) PT Visit Diagnosis: Muscle weakness (generalized) (M62.81);Difficulty in walking, not elsewhere classified (R26.2)     Time: 2440-1027 PT Time Calculation (min) (ACUTE ONLY): 32 min  Charges:  $Therapeutic Activity: 8-22 mins                     Anise Salvo, PT Acute Rehab Services Pager [phone] [facility] [phone]     Rayetta Humphrey 07/19/2020, 1:46 PM

## 2020-07-20 ENCOUNTER — Inpatient Hospital Stay (HOSPITAL_COMMUNITY): Payer: Medicare (Managed Care)

## 2020-07-20 DIAGNOSIS — E8770 Fluid overload, unspecified: Secondary | ICD-10-CM

## 2020-07-20 DIAGNOSIS — Z515 Encounter for palliative care: Secondary | ICD-10-CM

## 2020-07-20 DIAGNOSIS — G9340 Encephalopathy, unspecified: Secondary | ICD-10-CM | POA: Diagnosis not present

## 2020-07-20 DIAGNOSIS — R0602 Shortness of breath: Secondary | ICD-10-CM

## 2020-07-20 DIAGNOSIS — I5021 Acute systolic (congestive) heart failure: Secondary | ICD-10-CM

## 2020-07-20 DIAGNOSIS — Z7189 Other specified counseling: Secondary | ICD-10-CM

## 2020-07-20 DIAGNOSIS — J9601 Acute respiratory failure with hypoxia: Secondary | ICD-10-CM | POA: Diagnosis not present

## 2020-07-20 DIAGNOSIS — U071 COVID-19: Secondary | ICD-10-CM | POA: Diagnosis not present

## 2020-07-20 LAB — CBC WITH DIFFERENTIAL/PLATELET
Abs Immature Granulocytes: 0.18 10*3/uL — ABNORMAL HIGH (ref 0.00–0.07)
Basophils Absolute: 0 10*3/uL (ref 0.0–0.1)
Basophils Relative: 0 %
Eosinophils Absolute: 0 10*3/uL (ref 0.0–0.5)
Eosinophils Relative: 0 %
HCT: 40.9 % (ref 36.0–46.0)
Hemoglobin: 13.8 g/dL (ref 12.0–15.0)
Immature Granulocytes: 2 %
Lymphocytes Relative: 3 %
Lymphs Abs: 0.2 10*3/uL — ABNORMAL LOW (ref 0.7–4.0)
MCH: 30.9 pg (ref 26.0–34.0)
MCHC: 33.7 g/dL (ref 30.0–36.0)
MCV: 91.5 fL (ref 80.0–100.0)
Monocytes Absolute: 0.3 10*3/uL (ref 0.1–1.0)
Monocytes Relative: 3 %
Neutro Abs: 8.1 10*3/uL — ABNORMAL HIGH (ref 1.7–7.7)
Neutrophils Relative %: 92 %
Platelets: 214 10*3/uL (ref 150–400)
RBC: 4.47 MIL/uL (ref 3.87–5.11)
RDW: 14.3 % (ref 11.5–15.5)
WBC: 8.7 10*3/uL (ref 4.0–10.5)
nRBC: 0 % (ref 0.0–0.2)

## 2020-07-20 LAB — MAGNESIUM
Magnesium: 2.3 mg/dL (ref 1.7–2.4)
Magnesium: 2.3 mg/dL (ref 1.7–2.4)
Magnesium: 2.4 mg/dL (ref 1.7–2.4)

## 2020-07-20 LAB — COMPREHENSIVE METABOLIC PANEL
ALT: 30 U/L (ref 0–44)
AST: 47 U/L — ABNORMAL HIGH (ref 15–41)
Albumin: 3.6 g/dL (ref 3.5–5.0)
Alkaline Phosphatase: 83 U/L (ref 38–126)
Anion gap: 15 (ref 5–15)
BUN: 22 mg/dL (ref 8–23)
CO2: 28 mmol/L (ref 22–32)
Calcium: 8.6 mg/dL — ABNORMAL LOW (ref 8.9–10.3)
Chloride: 100 mmol/L (ref 98–111)
Creatinine, Ser: 1.26 mg/dL — ABNORMAL HIGH (ref 0.44–1.00)
GFR, Estimated: 42 mL/min — ABNORMAL LOW (ref >60)
Glucose, Bld: 198 mg/dL — ABNORMAL HIGH (ref 70–99)
Potassium: 3.4 mmol/L — ABNORMAL LOW (ref 3.5–5.1)
Sodium: 143 mmol/L (ref 135–145)
Total Bilirubin: 1.4 mg/dL — ABNORMAL HIGH (ref 0.3–1.2)
Total Protein: 6.7 g/dL (ref 6.5–8.1)

## 2020-07-20 LAB — GLUCOSE, CAPILLARY
Glucose-Capillary: 192 mg/dL — ABNORMAL HIGH (ref 70–99)
Glucose-Capillary: 227 mg/dL — ABNORMAL HIGH (ref 70–99)
Glucose-Capillary: 262 mg/dL — ABNORMAL HIGH (ref 70–99)
Glucose-Capillary: 290 mg/dL — ABNORMAL HIGH (ref 70–99)
Glucose-Capillary: 298 mg/dL — ABNORMAL HIGH (ref 70–99)

## 2020-07-20 LAB — C-REACTIVE PROTEIN: CRP: 2.4 mg/dL — ABNORMAL HIGH (ref <1.0)

## 2020-07-20 LAB — PHOSPHORUS
Phosphorus: 2.7 mg/dL (ref 2.5–4.6)
Phosphorus: 2.1 mg/dL — ABNORMAL LOW (ref 2.5–4.6)

## 2020-07-20 LAB — PROCALCITONIN
Procalcitonin: 0.19 ng/mL
Procalcitonin: 0.24 ng/mL

## 2020-07-20 LAB — BRAIN NATRIURETIC PEPTIDE: B Natriuretic Peptide: 458.9 pg/mL — ABNORMAL HIGH (ref 0.0–100.0)

## 2020-07-20 LAB — D-DIMER, QUANTITATIVE: D-Dimer, Quant: 1.25 ug/mL-FEU — ABNORMAL HIGH (ref 0.00–0.50)

## 2020-07-20 MED ORDER — ENOXAPARIN SODIUM 40 MG/0.4ML ~~LOC~~ SOLN
40.0000 mg | SUBCUTANEOUS | Status: DC
  Administered 2020-07-20 – 2020-07-21 (×2): 40 mg via SUBCUTANEOUS
  Filled 2020-07-20 (×2): qty 0.4

## 2020-07-20 MED ORDER — METHYLPREDNISOLONE SODIUM SUCC 40 MG IJ SOLR
40.0000 mg | INTRAMUSCULAR | Status: DC
  Administered 2020-07-20 – 2020-07-21 (×2): 40 mg via INTRAVENOUS
  Filled 2020-07-20 (×2): qty 1

## 2020-07-20 MED ORDER — POTASSIUM CHLORIDE 20 MEQ PO PACK
40.0000 meq | PACK | Freq: Once | ORAL | Status: AC
  Administered 2020-07-20: 40 meq
  Filled 2020-07-20: qty 2

## 2020-07-20 MED ORDER — FREE WATER
200.0000 mL | Freq: Three times a day (TID) | Status: DC
  Administered 2020-07-20 – 2020-07-21 (×5): 200 mL

## 2020-07-20 MED ORDER — AMOXICILLIN-POT CLAVULANATE 250-62.5 MG/5ML PO SUSR
500.0000 mg | Freq: Two times a day (BID) | ORAL | Status: DC
  Administered 2020-07-20 – 2020-07-21 (×4): 500 mg
  Filled 2020-07-20 (×7): qty 10

## 2020-07-20 MED ORDER — INSULIN ASPART 100 UNIT/ML ~~LOC~~ SOLN
0.0000 [IU] | SUBCUTANEOUS | Status: DC
  Administered 2020-07-21: 5 [IU] via SUBCUTANEOUS
  Administered 2020-07-21: 10 [IU] via SUBCUTANEOUS
  Administered 2020-07-21 (×2): 9 [IU] via SUBCUTANEOUS
  Administered 2020-07-21 (×2): 5 [IU] via SUBCUTANEOUS

## 2020-07-20 MED ORDER — INSULIN ASPART 100 UNIT/ML ~~LOC~~ SOLN: 0.0000 [IU] | Freq: Three times a day (TID) | SUBCUTANEOUS | Status: DC

## 2020-07-20 MED ORDER — INSULIN ASPART 100 UNIT/ML ~~LOC~~ SOLN
0.0000 [IU] | Freq: Every day | SUBCUTANEOUS | Status: DC
  Administered 2020-07-20: 3 [IU] via SUBCUTANEOUS

## 2020-07-20 MED ORDER — MORPHINE SULFATE (PF) 2 MG/ML IV SOLN
1.0000 mg | INTRAVENOUS | Status: DC | PRN
  Administered 2020-07-21: 1 mg via INTRAVENOUS
  Filled 2020-07-20: qty 1

## 2020-07-20 MED ORDER — POLYETHYLENE GLYCOL 3350 17 G PO PACK: 17.0000 g | PACK | Freq: Every day | ORAL | Status: DC | PRN

## 2020-07-20 MED ORDER — PROSOURCE TF PO LIQD
45.0000 mL | Freq: Every day | ORAL | Status: DC
  Administered 2020-07-20 – 2020-07-21 (×2): 45 mL
  Filled 2020-07-20 (×2): qty 45

## 2020-07-20 MED ORDER — FUROSEMIDE 10 MG/ML IJ SOLN
40.0000 mg | Freq: Once | INTRAMUSCULAR | Status: AC
  Administered 2020-07-20: 40 mg via INTRAVENOUS
  Filled 2020-07-20: qty 4

## 2020-07-20 MED ORDER — HYDROCOD POLST-CPM POLST ER 10-8 MG/5ML PO SUER: 5.0000 mL | Freq: Two times a day (BID) | ORAL | Status: DC | PRN

## 2020-07-20 MED ORDER — ACETAMINOPHEN 325 MG PO TABS
650.0000 mg | ORAL_TABLET | Freq: Four times a day (QID) | ORAL | Status: DC | PRN
  Administered 2020-07-20: 650 mg via ORAL
  Filled 2020-07-20 (×2): qty 2

## 2020-07-20 MED ORDER — BARICITINIB 2 MG PO TABS
2.0000 mg | ORAL_TABLET | Freq: Every day | ORAL | Status: DC
  Administered 2020-07-20 – 2020-07-21 (×2): 2 mg
  Filled 2020-07-20: qty 1

## 2020-07-20 MED ORDER — OSMOLITE 1.2 CAL PO LIQD
1000.0000 mL | ORAL | Status: DC
  Administered 2020-07-20: 1000 mL
  Filled 2020-07-20 (×3): qty 1000

## 2020-07-20 MED ORDER — ACETAMINOPHEN 325 MG PO TABS: 650.0000 mg | ORAL_TABLET | Freq: Four times a day (QID) | ORAL | Status: DC | PRN

## 2020-07-20 MED ORDER — GUAIFENESIN-DM 100-10 MG/5ML PO SYRP: 10.0000 mL | ORAL_SOLUTION | ORAL | Status: DC | PRN

## 2020-07-20 NOTE — Progress Notes (Signed)
PROGRESS NOTE    Kaitlyn Novak  IZT:[id number] DOB: 06-Mar-1934 DOA: 07-18-20 PCP: Patient, No Pcp Per    No chief complaint on file.   Brief Narrative:  87 year old unclear past medical history, thyroid disease, Patient recently moved in with her daughter from [place] over the summer.  Patient currently only taking daily vitamins at this time She presents from [place] primary care due to respiratory distress and hypoxia.   Patient was found to be hypoxic oxygen saturation in the 70s, increased work of breathing.  EMS was contacted, patient was a started on 15 L nonrebreather. Evaluation in the ED: Patient oxygen saturation 90 on 6 L, creatinine 1.2, white blood cell 2.7, platelet 96, troponin 51.  Covid PCR positive.  Procalcitonin 0.1    Subjective:  Continue to labored breathing on HFNC/NRB, sats dropped to the 70's-80's  She is very lethargic, only open eyes to voice briefly, she does not follow command Daughter at bedside  Assessment & Plan:   Principal Problem:   Acute respiratory failure with hypoxia (HCC) Active Problems:   Volume overload   COVID-19 virus infection   Troponin level elevated   Creatinine elevation   Acute systolic heart failure (HCC)   Encephalopathy   Palliative care by specialist   Goals of care, counseling/discussion   Acute hypoxic respiratory failure, Covid 19 pneumonia, pulmonary edema from heart failure -Continue to have severe hypoxia/labored breathing, add on heated oxygen, check stat x-ray, will give 1 dose of Lasix - she had a feeding tube placed, not sure BiPAP will be a good choice, case discussed with critical care who graciously accepted consult and will see patient today  COVID-19 pneumonia Received remdesivir, on IV Decadron, started on baricitinib January 28 -CRP and D-dimer appear trending down, however continue require high amount of oxygen  Acute systolic heart failure exacerbation -Previous documented patient volume  overloaded with lower extremity edema and pulmonary edema -Left ventricular EF 35%, cardiology consulted recommend medical management and outpatient follow-up -She received Lasix from January 26 to January 29, Lasix held due to AKI -received lasix on 2/2, cr continue to improve, will give another dose of lasix today   Mild elevation of troponin -No chest pain -Likely related to Covid versus heart failure -Cardiology consulted recommend outpatient follow-up  Hypokalemia, replace K  AKI on CKD 3B -Creatinine peaked at 1.8, improved today 1.26 -Continue renal dosing meds  Diabetes, type II -A1c 8.6 -Apparently not on current medication at home -Currently on Lantus 10 units daily and SSI  Hypothyroidism TSH 6, started on Synthroid  Acute metabolic encephalopathy CT head no acute findings, Nonspecific right greater than left mastoid and middle ear Opacification Start trial of augmentin per tube  FTT, poor prognosis Confirmed DNR status -family discussed with palliative care who wanted trial of tube feeds, core track and tube order placed   DVT prophylaxis: enoxaparin (LOVENOX) injection 30 mg Start: 07/14/20 2030   Code Status: Family Communication: Daughter at bedside Disposition:   Status is: Inpatient   Dispo: The patient is from: home              Anticipated d/c is to: TBD              Anticipated d/c date is: TBD              Patient currently not medically ready to discharge, remain significant hypoxia, on nonrebreather  Consultants:   Cardiology, has signed off  Palliative care  Procedures:  Core track feeding tube placement on thyroid the second  Antimicrobials:   Augmentin from February 2     Objective: Vitals:   07/20/20 0500 07/20/20 0655 07/20/20 0712 07/20/20 0728  BP:  (!) 149/134 123/82 129/79  Pulse:  96 93 94  Resp:  11 15 19   Temp:    98.3 F (36.8 C)  TempSrc:    Axillary  SpO2:  (!) 83% 92% (!) 86%  Weight: 81 kg     Height:         Intake/Output Summary (Last 24 hours) at 07/20/2020 0900 Last data filed at 07/20/2020 0809 Gross per 24 hour  Intake 280.46 ml  Output 1000 ml  Net -719.54 ml   Filed Weights   07/18/20 0500 07/19/20 0448 07/20/20 0500  Weight: 80.9 kg 80.9 kg 81 kg    Examination:  General exam: Very lethargic, open eyes to voice briefly Respiratory system: Diminished, but no wheezing, no rales no rhonchi.  Intermittent accessory muscle use Cardiovascular system: S1 & S2 heard, RRR.  Gastrointestinal system: Abdomen is nondistended, soft and nontender. Normal bowel sounds heard. Central nervous system: Very lethargic Extremities: Generalized weakness, trace edema bilateral lower extremity Skin: No rashes, lesions or ulcers Psychiatry: Very lethargic    Data Reviewed: I have personally reviewed following labs and imaging studies  CBC: Recent Labs  Lab 07/14/20 0253 07/15/20 0721 07/16/20 0328 07/17/20 0319 07/20/20 0134  WBC 5.6 6.5 7.0 8.2 8.7  NEUTROABS 4.8 5.8 6.2 7.5 8.1*  HGB 12.7 14.3 13.4 13.1 13.8  HCT 36.6 42.5 40.0 38.8 40.9  MCV 91.5 91.6 91.3 91.7 91.5  PLT 159 213 198 199 214    Basic Metabolic Panel: Recent Labs  Lab 07/14/20 0253 07/15/20 0721 07/16/20 0328 07/17/20 0319 07/18/20 0237 07/19/20 0500 07/20/20 0134 07/20/20 0733  NA 138 139 139 141 140  --  143  --   K 3.9 3.9 3.6 3.4* 3.4*  --  3.4*  --   CL 95* 95* 95* 97* 98  --  100  --   CO2 25 28 31 30 26   --  28  --   GLUCOSE 251* 237* 186* 189* 254*  --  198*  --   BUN 22 26* 33* 29* 25*  --  22  --   CREATININE 1.67* 1.63* 1.80* 1.54* 1.37* 1.34* 1.26*  --   CALCIUM 8.2* 8.2* 7.9* 8.1* 8.1*  --  8.6*  --   MG 1.9 1.9 1.9 2.0  --   --  2.3 2.3  PHOS 3.2 3.1 3.2 2.9  --   --   --  2.7    GFR: Estimated Creatinine Clearance: 31.3 mL/min (A) (by C-G formula based on SCr of 1.26 mg/dL (H)).  Liver Function Tests: Recent Labs  Lab 07/14/20 0253 07/15/20 0721 07/16/20 0328 07/17/20 0319  07/20/20 0134  AST 56* 57* 62* 54* 47*  ALT 31 32 31 29 30   ALKPHOS 41 47 52 58 83  BILITOT 0.9 1.0 1.1 1.0 1.4*  PROT 7.0 7.3 6.8 6.6 6.7  ALBUMIN 3.3* 3.5 3.3* 3.2* 3.6    CBG: Recent Labs  Lab 07/19/20 0736 07/19/20 1309 07/19/20 1712 07/19/20 2027 07/20/20 0752  GLUCAP 233* 199* 181* 196* 192*     Recent Results (from the past 240 hour(s))  SARS Coronavirus 2 by RT PCR (hospital order, performed in [facility] hospital lab) Nasopharyngeal Nasopharyngeal Swab     Status: Abnormal   Collection Time: 07/01/2020  3:52 PM  Specimen: Nasopharyngeal Swab  Result Value Ref Range Status   SARS Coronavirus 2 POSITIVE (A) NEGATIVE Final    Comment: RESULT CALLED TO, READ BACK BY AND VERIFIED WITH: Donnetta Hail RN 1720 06/17/2020 A BROWNING (NOTE) SARS-CoV-2 target nucleic acids are DETECTED  SARS-CoV-2 RNA is generally detectable in upper respiratory specimens  during the acute phase of infection.  Positive results are indicative  of the presence of the identified virus, but do not rule out bacterial infection or co-infection with other pathogens not detected by the test.  Clinical correlation with patient history and  other diagnostic information is necessary to determine patient infection status.  The expected result is negative.  Fact Sheet for Patients:   BoilerBrush.com.cy   Fact Sheet for Healthcare Providers:   https://pope.com/    This test is not yet approved or cleared by the [place] FDA and  has been authorized for detection and/or diagnosis of SARS-CoV-2 by FDA under an Emergency Use Authorization (EUA).  This EUA will remain in effect (meaning this t est can be used) for the duration of  the COVID-19 declaration under Section 564(b)(1) of the Act, 21 U.S.C. section 360-bbb-3(b)(1), unless the authorization is terminated or revoked sooner.  Performed at [facility], [address], [place],  [place] [address]   Blood Culture (routine x 2)     Status: None   Collection Time: 06/27/2020  5:34 PM   Specimen: BLOOD  Result Value Ref Range Status   Specimen Description BLOOD LEFT ANTECUBITAL  Final   Special Requests   Final    BOTTLES DRAWN AEROBIC AND ANAEROBIC Blood Culture results may not be optimal due to an inadequate volume of blood received in culture bottles   Culture   Final    NO GROWTH 5 DAYS Performed at [facility], [address], [place], [place] [address]    Report Status 07/17/2020 FINAL  Final         Radiology Studies: CT HEAD WO CONTRAST  Result Date: 07/18/2020 CLINICAL DATA:  Delirium EXAM: CT HEAD WITHOUT CONTRAST TECHNIQUE: Contiguous axial images were obtained from the base of the skull through the vertex without intravenous contrast. COMPARISON:  None. FINDINGS: Brain: There is no acute intracranial hemorrhage, mass effect, or edema. Gray-white differentiation is preserved. There is no extra-axial fluid collection. Ventricles and sulci are within normal limits in size and configuration. Vascular: There is atherosclerotic calcification at the skull base. Skull: Calvarium is unremarkable. Sinuses/Orbits: Mild mucosal thickening. Other: Right greater than left mastoid opacification. Right middle ear opacification. IMPRESSION: No acute intracranial abnormality. Nonspecific right greater than left mastoid and middle ear opacification. Electronically Signed   By: Guadlupe Spanish M.D.   On: 07/18/2020 19:53        Scheduled Meds: . amoxicillin-clavulanate  1 tablet Per Tube BID  . baricitinib  1 mg Oral Daily  . dexamethasone (DECADRON) injection  10 mg Intravenous Q12H  . enoxaparin (LOVENOX) injection  30 mg Subcutaneous Q24H  . feeding supplement (PROSource TF)  45 mL Per Tube Daily  . free water  200 mL Per Tube Q8H  . furosemide  40 mg Intravenous Once  . hydrALAZINE  10 mg Intravenous Q8H  . insulin aspart  0-15 Units Subcutaneous TID WC  . insulin  glargine  10 Units Subcutaneous Daily  . levothyroxine  12.5 mcg Intravenous Daily  . potassium chloride  40 mEq Per Tube Once  . sodium chloride flush  3 mL Intravenous  Q12H  . sodium chloride flush  3 mL Intravenous Q12H   Continuous Infusions: . sodium chloride    . feeding supplement (OSMOLITE 1.2 CAL) 1,000 mL (07/20/20 0802)     LOS: 8 days   Time spent: 35 mins, case discussed with critical care and palliative care Greater than 50% of this time was spent in counseling, explanation of diagnosis, planning of further management, and coordination of care.  I have personally reviewed and interpreted on  07/20/2020 daily labs, tele strips, imagings as discussed above under date review session and assessment and plans.  I reviewed all nursing notes, pharmacy notes, consultant notes,  vitals, pertinent old records  I have discussed plan of care as described above with RN , patient and family on 07/20/2020  Voice Recognition /Dragon dictation system was used to create this note, attempts have been made to correct errors. Please contact the author with questions and/or clarifications.   Albertine Grates, MD PhD FACP [facility]  Available via Epic secure chat 7am-7pm for nonurgent issues Please page for urgent issues To page the attending provider between 7A-7P or the covering provider during after hours 7P-7A, please log into the web site [email or web address] and access using universal [place] password for that web site. If you do not have the password, please call the hospital operator.    07/20/2020, 9:00 AM

## 2020-07-20 NOTE — Progress Notes (Signed)
RT note-Called for patient with moderate labored breathing, sp02 in the low 80's. HFNC 40L/100% placed at this time, with NRB. Sp02 now 90-91%.

## 2020-07-20 NOTE — Progress Notes (Signed)
Daily Progress Note   Patient Name: Kaitlyn Novak       Date: 07/20/2020 DOB: Oct 17, 1933  Age: 87 y.o. MRN#: [id number] Attending Physician: Albertine Grates, MD Primary Care Physician: Patient, No Pcp Per Admit Date: 08-01-2020  Reason for Consultation/Follow-up: Establishing goals of care  Subjective: Patient opens eyes to voice. She will squeeze her daughter's hand but nonverbal. Does not engage in discussion. RR 16 this afternoon. Oxygen sats 92% on heated high flow and NRB mask. Intermittent, slight accessory muscle use but overall appears more comfortable than reported by RN and PCCM NP this AM.   GOC:  Daughter, Rinaldo Cloud at bedside visiting.   She spoke with PCCM NP this AM, Whitney regarding her mother's condition and prognosis. Confirmed her mother's wish for DNR/DNI. Expressed concern with worsening respiratory status and tachypnea this AM requiring higher oxygen requirement and lasix. Rinaldo Cloud acknowledges her mother is very frail and appears worse than when she saw her on Sunday. Although knowing this, Rinaldo Cloud shares that she struggles with the thought of "giving up" on her mother. She is not ready for shift to comfort. Encouraged addition of prn morphine to her mother's medication list if she does have future episodes like this AM and we do not wish for her to struggle or suffer. Daughter is agreeable with adding morphine to [facility] for dyspnea/pain.   Therapeutic listening. Rinaldo Cloud is disheartened by how different her mother is than when initially admitted 8 days ago. Support provided.   Answered questions. PMT contact information given.   Updated RN, Dr Roda Shutters, and Whitney PCCM  Length of Stay: 8  Current Medications: Scheduled Meds:  . amoxicillin-clavulanate  500 mg Per Tube Q12H  . baricitinib   2 mg Per Tube Daily  . dexamethasone (DECADRON) injection  10 mg Intravenous Q12H  . enoxaparin (LOVENOX) injection  40 mg Subcutaneous Q24H  . feeding supplement (PROSource TF)  45 mL Per Tube Daily  . free water  200 mL Per Tube Q8H  . hydrALAZINE  10 mg Intravenous Q8H  . insulin aspart  0-15 Units Subcutaneous TID WC  . insulin glargine  10 Units Subcutaneous Daily  . levothyroxine  12.5 mcg Intravenous Daily  . sodium chloride flush  3 mL Intravenous Q12H  . sodium chloride flush  3 mL Intravenous Q12H    Continuous  Infusions: . sodium chloride    . feeding supplement (OSMOLITE 1.2 CAL) 1,000 mL (07/20/20 0802)    PRN Meds: sodium chloride, acetaminophen, chlorpheniramine-HYDROcodone, guaiFENesin-dextromethorphan, haloperidol lactate, lip balm, polyethylene glycol, sodium chloride flush  Physical Exam Vitals and nursing note reviewed.  Constitutional:      Appearance: She is ill-appearing.  Cardiovascular:     Rate and Rhythm: Normal rate.  Pulmonary:     Effort: Accessory muscle usage present. No tachypnea or respiratory distress.     Breath sounds: Decreased breath sounds present.     Comments: Heated high flow and NRB mask 15L  Neurological:     Mental Status: She is easily aroused.     Comments: Will wake to voice, does not engage. Nonverbal.  Psychiatric:        Attention and Perception: She is inattentive.        Speech: She is noncommunicative.        Cognition and Memory: Cognition is impaired.            Vital Signs: BP 114/79 (BP Location: Left Arm)   Pulse 80   Temp 97.8 F (36.6 C) (Axillary)   Resp 20   Ht 5' (1.524 m)   Wt 81 kg   SpO2 95%   BMI 34.88 kg/m  SpO2: SpO2: 95 % O2 Device: O2 Device: High Flow Nasal Cannula O2 Flow Rate: O2 Flow Rate (L/min): 40 L/min  Intake/output summary:   Intake/Output Summary (Last 24 hours) at 07/20/2020 1316 Last data filed at 07/20/2020 1114 Gross per 24 hour  Intake 280.46 ml  Output 1500 ml  Net  -1219.54 ml   LBM: Last BM Date: 07/10/20 Baseline Weight: Weight: 75.1 kg Most recent weight: Weight: 81 kg       Palliative Assessment/Data: PPS 20%    Flowsheet Rows   Flowsheet Row Most Recent Value  Intake Tab   Referral Department Hospitalist  Unit at Time of Referral Cardiac/Telemetry Unit  Palliative Care Primary Diagnosis Sepsis/Infectious Disease  Palliative Care Type New Palliative care  Reason for referral Clarify Goals of Care  Date first seen by Palliative Care 07/19/20  Clinical Assessment   Palliative Performance Scale Score 20%  Psychosocial & Spiritual Assessment   Palliative Care Outcomes   Patient/Family meeting held? Yes  Who was at the meeting? daughter Rinaldo Cloud)  Palliative Care Outcomes Clarified goals of care, Provided psychosocial or spiritual support, ACP counseling assistance, Provided end of life care assistance      Patient Active Problem List   Diagnosis Date Noted  . Acute systolic heart failure (HCC)   . Encephalopathy   . Palliative care by specialist   . Goals of care, counseling/discussion   . Acute respiratory failure with hypoxia (HCC) 07/17/2020  . Volume overload 06/30/2020  . COVID-19 virus infection 06/26/2020  . Troponin level elevated 07/04/2020  . Creatinine elevation 06/25/2020    Palliative Care Assessment & Plan   Patient Profile: 87 y.o. female  with past medical history of thyroid disease, recently moved from [place] in August 2021 to live in [place] with her daughter admitted on 06/24/2020 from PCP appointment due to respiratory distress and hypoxia, oxygen in the 70's. Hospital admission for acute hypoxic respiratory failure secondary to COVID 19 pneumonia, pulmonary edema from acute systolic CHF with EF 35%, AKI on CKD, DM type II, acute metabolic encephalopathy with negative CT head, and overall failure to thrive. Patient's condition remains tenuous requiring 15L HFNC and 15L NRB mask. Remains encephalopathic with  poor oral  intake due to NPO recommendation. Palliative medicine consultation for goals of care.   Assessment: Acute hypoxic respiratory failure COVID 19 pneumonia Pulmonary edema Systolic CHF with EF 35% Type II DM Acute metabolic encephalopathy Adult failure to thrive  Recommendations/Plan:  Worsening clinical condition this morning. PCCM reconsulted. Patient is a DNR/DNI. Recommending comfort care be considered.   F/u GOC with daughter. She does understand the severity of her mother's condition and poor prognosis but remains hopeful and not ready to give up on her yet.   Continue current plan of care and medical management. Tenuous.   Daughter agreeable with addition of prn morphine to [facility] for dyspnea/tachypnea/pain.  PMT will follow.   Code Status: DNR   Code Status Orders  (From admission, onward)         Start     Ordered   07/15/20 1404  Do not attempt resuscitation (DNR)  Continuous       Question Answer Comment  In the event of cardiac or respiratory ARREST Do not call a "code blue"   In the event of cardiac or respiratory ARREST Do not perform Intubation, CPR, defibrillation or ACLS   In the event of cardiac or respiratory ARREST Use medication by any route, position, wound care, and other measures to relive pain and suffering. May use oxygen, suction and manual treatment of airway obstruction as needed for comfort.      07/15/20 1403        Code Status History    Date Active Date Inactive Code Status Order ID Comments User Context   Jul 24, 2020 2024 07/15/2020 1403 Full Code [id number]  Synetta Fail, MD ED   Advance Care Planning Activity       Prognosis:   Tenuous   Discharge Planning:  To Be Determined  Care plan was discussed with RN, Alphonzo Lemmings PCCM NP, Dr. Roda Shutters, daughter Rinaldo Cloud)  Thank you for allowing the Palliative Medicine Team to assist in the care of this patient.   Total Time 40 Prolonged Time Billed no      Greater than 50%  of this time  was spent counseling and coordinating care related to the above assessment and plan.  Vennie Homans, DNP, FNP-C Palliative Medicine Team  Phone: [phone] Fax: [phone]  Please contact Palliative Medicine Team phone at [phone] for questions and concerns.

## 2020-07-20 NOTE — Care Management Important Message (Signed)
Important Message  Patient Details  Name: Kaitlyn Novak MRN: [id number] Date of Birth: 05/17/1934   Medicare Important Message Given:  Yes - Important Message mailed due to current National Emergency  Verbal consent obtained due to current National Emergency  Relationship to patient: Self Contact Name: Skylene Deremer Call Date: 07/20/20  Time: 1429 Phone: [phone] Outcome: No Answer/Busy Important Message mailed to: Patient address on file    Orson Aloe 07/20/2020, 2:29 PM

## 2020-07-20 NOTE — Consult Note (Signed)
NAME:  Kaitlyn Novak, MRN:  [id number], DOB:  1934/03/30, LOS: 8 ADMISSION DATE:  07/14/2020, CONSULTATION DATE:  07/20/2198 REFERRING MD:  Dr. Erlinda Hong, CHIEF COMPLAINT:  Progressive respiratory decline   Brief History:  87 year old female presented with acute respiratory distress and hypoxia found to have Covid pneumonia.  Thus far during hospitalization patient has received treatment with remdesivir, IV Decadron, and Barictinib with improvement in inflammatory markers but continues to require increasing levels of supplemental oxygen.  PCCM consulted  History of Present Illness:  Kaitlyn Novak is a 87 year old female with limited known past medical history who presented to outpatient primary care physician's office with complaints of respiratory distress and hypoxia.  Patient was found profoundly hypoxic with oxygen saturations of 70% at PCPs office.  This prompted EMS call and transfer to ED.  Patient was found to be Covid positive.   Thus far during hospitalization she has received remdesivir, IV Decadron, and Barictinib with improvement in inflammatory markers.  Only the patient continues to require progressively increasing levels of supplemental oxygen.  She currently is requiring 40 L heated high flow and 15 L nonrebreather with continued accessory muscle use and discomfort seen.  Patient is nonverbal on assessment this morning but nonverbal cues indicates she is in discomfort.  Of note palliative care met with daughter 07/19/20 with decision made for DNR/DNI but continued full scope of treatment Short trial of tube feeds.  Past Medical History:  Full medical history unknown patient was recently cared for in [place] with no medical records to review.  Significant Hospital Events:  Admitted 06/23/2020 for Covid pneumonia  Consults:  Neurology Cardiology Palliative care  Procedures:    Significant Diagnostic Tests:  Chest x-ray 2/3 > progressive bilateral airspace disease  Micro Data:   COVID 1/26 > positive Blood cultures 1/26 > negative  Antimicrobials:  Augmentin 2/2 >   Interim History / Subjective:  Seen lying in bed, she will open eyes to verbal stimuli but unable to answer any questions or follow any commands. Progressively increasing supplemental oxygen requirement, currently on 40 L heated high flow and nonrebreather  Objective   Blood pressure 129/79, pulse 94, temperature 98.3 F (36.8 C), temperature source Axillary, resp. rate 19, height 5' (1.524 m), weight 81 kg, SpO2 90 %.    FiO2 (%):  [100 %] 100 %   Intake/Output Summary (Last 24 hours) at 07/20/2020 0945 Last data filed at 07/20/2020 0809 Gross per 24 hour  Intake 280.46 ml  Output 1000 ml  Net -719.54 ml   Filed Weights   07/18/20 0500 07/19/20 0448 07/20/20 0500  Weight: 80.9 kg 80.9 kg 81 kg    Examination: General: Acute on chronic ill-appearing elderly female lying in bed in mild respiratory distress.  She appears uncomfortable and possibly in pain. HEENT: Scenic/AT, MM pink/dry, PERRL,  Neuro: Appears encephalopathic, unable to follow any commands, will track movement and movement.  Forehead furloughed CV: s1s2 regular rate and rhythm, no murmur, rubs, or gallops,  PULM: Significantly diminished breath sounds bilaterally, mild muscle use, respiratory rate low 20s with oxygen saturations 87 to 91% on 40 L heated high flow and 15 L nonrebreather GI: soft, bowel sounds active in all 4 quadrants, non-tender, non-distended, tolerating TF Extremities: warm/dry, no edema  Skin: no rashes or lesions  Resolved Hospital Problem list     Assessment & Plan:  COVID pneumonia Acute hypoxic respiratory failure Acute systolic congestive heart failure exasperation Acute kidney injury superimposed on CKD stage IIIb  Plan:  Redose Lasix today as chest x-ray shows progressive worsening Continue high flow nasal cannula and nonrebreather Can consider use of BiPAP but given presence of core track and  tube feeds patient is at significantly elevated risk for aspiration.  Would avoid this  Given patient's underlying underlying comorbidities including CHF and CKD and progressively worsening acute hypoxic respiratory failure prognosis remains poor. I spoke with her daughter today and she verbalized understanding and requested visitation with patient prior to any further decisions being made. Recommend comfort measures be strongly considered. I spoke with palliative care team and they will follow up with daughter on arrival.   At this time there are no other pulmonary or critical care resources left to offer.PCCM will sign off. Thank you for the opportunity to participate in this patient's care. Please contact if we can be of further assistance.  Best practice (evaluated daily)  Diet: TF Pain/Anxiety/Delirium protocol (if indicated): As needed  VAP protocol (if indicated): N/A DVT prophylaxis: Lovenox  GI prophylaxis: PPI Glucose control: SSI Mobility: Bedrest Disposition:DNR/DNI  Goals of Care:  Last date of multidisciplinary goals of care discussion:2/2 Family and staff present: Daughter and palliative care  Summary of discussion: DNR/DNI Follow up goals of care discussion due: Spoke with daughter personally 2/3 for continued goals of care discussion. Daughter would like to visit mom prior to decision is being made Code Status: DNI/DNR  Labs   CBC: Recent Labs  Lab 07/14/20 0253 07/15/20 0721 07/16/20 0328 07/17/20 0319 07/20/20 0134  WBC 5.6 6.5 7.0 8.2 8.7  NEUTROABS 4.8 5.8 6.2 7.5 8.1*  HGB 12.7 14.3 13.4 13.1 13.8  HCT 36.6 42.5 40.0 38.8 40.9  MCV 91.5 91.6 91.3 91.7 91.5  PLT 159 213 198 199 532    Basic Metabolic Panel: Recent Labs  Lab 07/14/20 0253 07/15/20 0721 07/16/20 0328 07/17/20 0319 07/18/20 0237 07/19/20 0500 07/20/20 0134 07/20/20 0733  NA 138 139 139 141 140  --  143  --   K 3.9 3.9 3.6 3.4* 3.4*  --  3.4*  --   CL 95* 95* 95* 97* 98  --  100   --   CO2 _0 --  28  --   GLUCOSE 251* 237* 186* 189* 254*  --  198*  --   BUN 22 26* 33* 29* 25*  --  22  --   CREATININE 1.67* 1.63* 1.80* 1.54* 1.37* 1.34* 1.26*  --   CALCIUM 8.2* 8.2* 7.9* 8.1* 8.1*  --  8.6*  --   MG 1.9 1.9 1.9 2.0  --   --  2.3 2.3  PHOS 3.2 3.1 3.2 2.9  --   --   --  2.7   GFR: Estimated Creatinine Clearance: 31.3 mL/min (A) (by C-G formula based on SCr of 1.26 mg/dL (H)). Recent Labs  Lab 07/15/20 0721 07/16/20 0328 07/17/20 0319 07/20/20 0134  WBC 6.5 7.0 8.2 8.7    Liver Function Tests: Recent Labs  Lab 07/14/20 0253 07/15/20 0721 07/16/20 0328 07/17/20 0319 07/20/20 0134  AST 56* 57* 62* 54* 47*  ALT 31 32 _1 ALKPHOS 41 47 52 58 83  BILITOT 0.9 1.0 1.1 1.0 1.4*  PROT 7.0 7.3 6.8 6.6 6.7  ALBUMIN 3.3* 3.5 3.3* 3.2* 3.6   No results for input(s): LIPASE, AMYLASE in the last 168 hours. No results for input(s): AMMONIA in the last 168 hours.  ABG    Component Value Date/Time   PHART 7.470 (  H) 07/16/2020 0315   PCO2ART 43.9 07/16/2020 0315   PO2ART 52.8 (L) 07/16/2020 0315   HCO3 31.5 (H) 07/16/2020 0315   TCO2 35 (H) 07/06/2020 1601   O2SAT 86.4 07/16/2020 0315     Coagulation Profile: No results for input(s): INR, PROTIME in the last 168 hours.  Cardiac Enzymes: No results for input(s): CKTOTAL, CKMB, CKMBINDEX, TROPONINI in the last 168 hours.  HbA1C: Hgb A1c MFr Bld  Date/Time Value Ref Range Status  07/13/2020 04:52 AM 8.6 (H) 4.8 - 5.6 % Final    Comment:    (NOTE) Pre diabetes:          5.7%-6.4%  Diabetes:              >6.4%  Glycemic control for   <7.0% adults with diabetes     CBG: Recent Labs  Lab 07/19/20 0736 07/19/20 1309 07/19/20 1712 07/19/20 2027 07/20/20 0752  GLUCAP 233* 199* 181* 196* 192*    Review of Systems:   Encephalopathic unable to gather  Past Medical History:  She,  has a past medical history of Thyroid disease.   Surgical History:  History reviewed. No  pertinent surgical history.   Social History:   reports that she has never smoked. She has never used smokeless tobacco.   Family History:  Her family history includes Lung disease in her father.   Allergies No Known Allergies   Home Medications  Prior to Admission medications   Medication Sig Start Date End Date Taking? Authorizing Provider  Multiple Vitamin (MULTIVITAMIN ADULT) TABS Take 1 tablet by mouth daily.   Yes [provider]     Signature:  Johnsie Cancel, NP-C [facility] Personal contact information can be found on Amion  If no response please page: Adult pulmonary and critical care medicine pager on Amion unitl 7pm After 7pm please call [phone] 07/20/2020, 10:33 AM

## 2020-07-20 NOTE — Plan of Care (Signed)
  Problem: Education: Goal: Knowledge of risk factors and measures for prevention of condition will improve 07/20/2020 0056 by Rennie Plowman, RN Outcome: Progressing 07/20/2020 0056 by Rennie Plowman, RN Outcome: Progressing   Problem: Coping: Goal: Psychosocial and spiritual needs will be supported 07/20/2020 0056 by Rennie Plowman, RN Outcome: Progressing 07/20/2020 0056 by Rennie Plowman, RN Outcome: Progressing   Problem: Respiratory: Goal: Will maintain a patent airway 07/20/2020 0056 by Rennie Plowman, RN Outcome: Progressing 07/20/2020 0056 by Rennie Plowman, RN Outcome: Progressing Goal: Complications related to the disease process, condition or treatment will be avoided or minimized 07/20/2020 0056 by Rennie Plowman, RN Outcome: Progressing 07/20/2020 0056 by Rennie Plowman, RN Outcome: Progressing   Problem: Safety: Goal: Non-violent Restraint(s) 07/20/2020 0056 by Rennie Plowman, RN Outcome: Completed/Met 07/20/2020 0056 by Rennie Plowman, RN Outcome: Progressing   Problem: Safety: Goal: Non-violent Restraint(s) 07/20/2020 0056 by Rennie Plowman, RN Outcome: Completed/Met 07/20/2020 0056 by Rennie Plowman, RN Outcome: Progressing   Problem: Education: Goal: Knowledge of General Education information will improve Description: Including pain rating scale, medication(s)/side effects and non-pharmacologic comfort measures 07/20/2020 0056 by Rennie Plowman, RN Outcome: Progressing 07/20/2020 0056 by Rennie Plowman, RN Outcome: Progressing   Problem: Health Behavior/Discharge Planning: Goal: Ability to manage health-related needs will improve 07/20/2020 0056 by Rennie Plowman, RN Outcome: Progressing 07/20/2020 0056 by Rennie Plowman, RN Outcome: Progressing   Problem: Clinical Measurements: Goal: Ability to maintain clinical measurements within normal limits will improve 07/20/2020 0056 by Rennie Plowman, RN Outcome:  Progressing 07/20/2020 0056 by Rennie Plowman, RN Outcome: Progressing Goal: Will remain free from infection 07/20/2020 0056 by Rennie Plowman, RN Outcome: Progressing 07/20/2020 0056 by Rennie Plowman, RN Outcome: Progressing Goal: Diagnostic test results will improve 07/20/2020 0056 by Rennie Plowman, RN Outcome: Progressing 07/20/2020 0056 by Rennie Plowman, RN Outcome: Progressing Goal: Respiratory complications will improve 07/20/2020 0056 by Rennie Plowman, RN Outcome: Progressing 07/20/2020 0056 by Rennie Plowman, RN Outcome: Progressing Goal: Cardiovascular complication will be avoided 07/20/2020 0056 by Rennie Plowman, RN Outcome: Progressing 07/20/2020 0056 by Rennie Plowman, RN Outcome: Progressing   Problem: Activity: Goal: Risk for activity intolerance will decrease 07/20/2020 0056 by Rennie Plowman, RN Outcome: Progressing 07/20/2020 0056 by Rennie Plowman, RN Outcome: Progressing   Problem: Coping: Goal: Level of anxiety will decrease 07/20/2020 0056 by Rennie Plowman, RN Outcome: Progressing 07/20/2020 0056 by Rennie Plowman, RN Outcome: Progressing   Problem: Elimination: Goal: Will not experience complications related to bowel motility 07/20/2020 0056 by Rennie Plowman, RN Outcome: Progressing 07/20/2020 0056 by Rennie Plowman, RN Outcome: Progressing Goal: Will not experience complications related to urinary retention 07/20/2020 0056 by Rennie Plowman, RN Outcome: Progressing 07/20/2020 0056 by Rennie Plowman, RN Outcome: Progressing   Problem: Pain Managment: Goal: General experience of comfort will improve 07/20/2020 0056 by Rennie Plowman, RN Outcome: Progressing 07/20/2020 0056 by Rennie Plowman, RN Outcome: Progressing   Problem: Safety: Goal: Ability to remain free from injury will improve 07/20/2020 0056 by Rennie Plowman, RN Outcome: Progressing 07/20/2020 0056 by Rennie Plowman, RN Outcome: Progressing    Problem: Skin Integrity: Goal: Risk for impaired skin integrity will decrease 07/20/2020 0056 by Rennie Plowman, RN Outcome: Progressing 07/20/2020 0056 by Rennie Plowman, RN Outcome: Progressing

## 2020-07-20 NOTE — Progress Notes (Signed)
SLP Cancellation Note  Patient Details Name: Kaitlyn Novak MRN: [id number] DOB: 25-May-1934   Cancelled treatment:       Reason Eval/Treat Not Completed: Fatigue/lethargy limiting ability to participate (Pt's level of alertness was inadequate for treatment. Cortrak was placed on 2/2 per family's request. SLP will follow up next week unless contacted sooner by staff due to improvement in status.)  Lai Hendriks I. Vear Clock, MS, CCC-SLP Acute Rehabilitation Services Office number [phone] Pager [phone]  Scheryl Marten 07/20/2020, 3:05 PM

## 2020-07-20 NOTE — Progress Notes (Signed)
Patient's daughter, Rinaldo Cloud, [phone], has been updated at 2224.  Questions and concerns have been addressed.

## 2020-07-21 DIAGNOSIS — I5021 Acute systolic (congestive) heart failure: Secondary | ICD-10-CM | POA: Diagnosis not present

## 2020-07-21 DIAGNOSIS — U071 COVID-19: Secondary | ICD-10-CM | POA: Diagnosis not present

## 2020-07-21 DIAGNOSIS — G9340 Encephalopathy, unspecified: Secondary | ICD-10-CM | POA: Diagnosis not present

## 2020-07-21 DIAGNOSIS — J9601 Acute respiratory failure with hypoxia: Secondary | ICD-10-CM | POA: Diagnosis not present

## 2020-07-21 LAB — CBC WITH DIFFERENTIAL/PLATELET
Abs Immature Granulocytes: 0.19 10*3/uL — ABNORMAL HIGH (ref 0.00–0.07)
Basophils Absolute: 0 10*3/uL (ref 0.0–0.1)
Basophils Relative: 0 %
Eosinophils Absolute: 0 10*3/uL (ref 0.0–0.5)
Eosinophils Relative: 0 %
HCT: 41.1 % (ref 36.0–46.0)
Hemoglobin: 13.7 g/dL (ref 12.0–15.0)
Immature Granulocytes: 2 %
Lymphocytes Relative: 3 %
Lymphs Abs: 0.3 10*3/uL — ABNORMAL LOW (ref 0.7–4.0)
MCH: 30.4 pg (ref 26.0–34.0)
MCHC: 33.3 g/dL (ref 30.0–36.0)
MCV: 91.1 fL (ref 80.0–100.0)
Monocytes Absolute: 0.2 10*3/uL (ref 0.1–1.0)
Monocytes Relative: 3 %
Neutro Abs: 7.2 10*3/uL (ref 1.7–7.7)
Neutrophils Relative %: 92 %
Platelets: 204 10*3/uL (ref 150–400)
RBC: 4.51 MIL/uL (ref 3.87–5.11)
RDW: 14.4 % (ref 11.5–15.5)
WBC: 7.8 10*3/uL (ref 4.0–10.5)
nRBC: 0 % (ref 0.0–0.2)

## 2020-07-21 LAB — COMPREHENSIVE METABOLIC PANEL
ALT: 32 U/L (ref 0–44)
AST: 49 U/L — ABNORMAL HIGH (ref 15–41)
Albumin: 3.3 g/dL — ABNORMAL LOW (ref 3.5–5.0)
Alkaline Phosphatase: 106 U/L (ref 38–126)
Anion gap: 14 (ref 5–15)
BUN: 31 mg/dL — ABNORMAL HIGH (ref 8–23)
CO2: 30 mmol/L (ref 22–32)
Calcium: 8.7 mg/dL — ABNORMAL LOW (ref 8.9–10.3)
Chloride: 96 mmol/L — ABNORMAL LOW (ref 98–111)
Creatinine, Ser: 1.43 mg/dL — ABNORMAL HIGH (ref 0.44–1.00)
GFR, Estimated: 36 mL/min — ABNORMAL LOW (ref >60)
Glucose, Bld: 376 mg/dL — ABNORMAL HIGH (ref 70–99)
Potassium: 3.9 mmol/L (ref 3.5–5.1)
Sodium: 140 mmol/L (ref 135–145)
Total Bilirubin: 1.3 mg/dL — ABNORMAL HIGH (ref 0.3–1.2)
Total Protein: 6.7 g/dL (ref 6.5–8.1)

## 2020-07-21 LAB — PHOSPHORUS
Phosphorus: 1.6 mg/dL — ABNORMAL LOW (ref 2.5–4.6)
Phosphorus: 2.4 mg/dL — ABNORMAL LOW (ref 2.5–4.6)

## 2020-07-21 LAB — PROCALCITONIN: Procalcitonin: 0.24 ng/mL

## 2020-07-21 LAB — GLUCOSE, RANDOM: Glucose, Bld: 435 mg/dL — ABNORMAL HIGH (ref 70–99)

## 2020-07-21 LAB — MAGNESIUM
Magnesium: 2.4 mg/dL (ref 1.7–2.4)
Magnesium: 2.6 mg/dL — ABNORMAL HIGH (ref 1.7–2.4)

## 2020-07-21 LAB — C-REACTIVE PROTEIN: CRP: 2.4 mg/dL — ABNORMAL HIGH (ref <1.0)

## 2020-07-21 LAB — GLUCOSE, CAPILLARY: Glucose-Capillary: 425 mg/dL — ABNORMAL HIGH (ref 70–99)

## 2020-07-21 LAB — D-DIMER, QUANTITATIVE: D-Dimer, Quant: 1.07 ug/mL-FEU — ABNORMAL HIGH (ref 0.00–0.50)

## 2020-07-21 MED ORDER — INSULIN GLARGINE 100 UNIT/ML ~~LOC~~ SOLN
10.0000 [IU] | Freq: Two times a day (BID) | SUBCUTANEOUS | Status: DC
  Administered 2020-07-21 (×2): 10 [IU] via SUBCUTANEOUS
  Filled 2020-07-21 (×3): qty 0.1

## 2020-07-21 MED ORDER — GLUCERNA 1.5 CAL PO LIQD
1000.0000 mL | ORAL | Status: DC
  Administered 2020-07-21: 1000 mL
  Filled 2020-07-21 (×2): qty 1000

## 2020-07-21 MED ORDER — K PHOS MONO-SOD PHOS DI & MONO 155-852-130 MG PO TABS
250.0000 mg | ORAL_TABLET | Freq: Two times a day (BID) | ORAL | Status: DC
  Administered 2020-07-21 (×2): 250 mg
  Filled 2020-07-21 (×2): qty 1

## 2020-07-21 MED ORDER — FREE WATER
100.0000 mL | Status: DC
  Administered 2020-07-21: 100 mL

## 2020-07-21 MED ORDER — FREE WATER
150.0000 mL | Status: DC
  Administered 2020-07-21: 150 mL

## 2020-08-14 ENCOUNTER — Ambulatory Visit: Payer: Medicare (Managed Care) | Admitting: General Practice

## 2020-08-15 NOTE — Plan of Care (Signed)
Patient currently resting in bed. Q2 turns. Purewick in place.   Attempted oral care, patient refusing to open mouth. Lips moisturized.  Patient has a productive cough, can hear her bring up sputum. Patient clenches down on teeth when trying to suction.   Provider made aware about increase in blood sugar checks, patient started on sliding scale insulin Q4. Provider aware of BGM over 400 this AM, 10 units given, stat lab draw.   VSS. BP elevated at times, patient becomes really tense when trying to do anything with her. Moving arms and legs during vitals. Patient remains on HHFNC with NRB. HHFNC was decreased to 30L overnight but throughout the night had to be increased. Patient currently at 40L 100%. Pulse ox changed from finger to ear, oxygen saturation improved.  Haldol given once for agitated. Mitts in place. Patient attempting to pull at equipment. Some relieve after it was given but only for a short moment.   Call bell within reach. Bed alarm on. Frequent rounding performed.   Problem: Education: Goal: Knowledge of risk factors and measures for prevention of condition will improve Outcome: Not Progressing   Problem: Coping: Goal: Psychosocial and spiritual needs will be supported Outcome: Not Progressing   Problem: Respiratory: Goal: Will maintain a patent airway Outcome: Not Progressing Goal: Complications related to the disease process, condition or treatment will be avoided or minimized Outcome: Not Progressing

## 2020-08-15 NOTE — Progress Notes (Signed)
PROGRESS NOTE    Kaitlyn Novak  HWK:[id number] DOB: [birth year] DOA: 07/14/2020 PCP: Patient, No Pcp Per    No chief complaint on file.   Brief Narrative:  87 year old unclear past medical history, thyroid disease, Patient recently moved in with her daughter from [place] over the summer.  Patient currently only taking daily vitamins at this time She presents from [facility] due to respiratory distress and hypoxia.   Patient was found to be hypoxic oxygen saturation in the 70s, increased work of breathing.  EMS was contacted, patient was a started on 15 L nonrebreather. Evaluation in the ED: Patient oxygen saturation 90 on 6 L, creatinine 1.2, white blood cell 2.7, platelet 96, troponin 51.  Covid PCR positive.  Procalcitonin 0.1    Subjective:  Continue to labored breathing on heated HFNC/NRB,  Hyperglycemia, hypophostemia   She is very lethargic, only open eyes to voice briefly, she does not follow command   Assessment & Plan:   Principal Problem:   Acute respiratory failure with hypoxia (HCC) Active Problems:   Volume overload   COVID-19 virus infection   Troponin level elevated   Creatinine elevation   Acute systolic heart failure (HCC)   Encephalopathy   Palliative care by specialist   Goals of care, counseling/discussion   Acute hypoxic respiratory failure, Covid 19 pneumonia, pulmonary edema from heart failure -Continue to have severe hypoxia/labored breathing, add on heated oxygen, received extra dose of Lasix , getting Augmentin to cover bacteria infection  -She is not improving, critical care consulted and recommended palliative care -Palliative care following, daughter state want patient to be comfortable but not ready to start full comfort measure yet  COVID-19 pneumonia Received remdesivir, on IV Decadron, started on baricitinib January 28 -CRP and D-dimer appear trending down, however continue require high amount of oxygen, clinically not  improving  Acute systolic heart failure exacerbation -Previous documented patient volume overloaded with lower extremity edema and pulmonary edema -Left ventricular EF 35%, cardiology consulted recommend medical management and outpatient follow-up -She received Lasix from January 26 to January 29, Lasix held due to AKI -Received extra dose of Lasix, now creatinine got worse -Continue goals of care discussion   Mild elevation of troponin -No chest pain -Likely related to Covid versus heart failure -Cardiology consulted recommend outpatient follow-up  Hypokalemia, replace K Hypophosphatemia: replace phos   AKI on CKD 3B -Creatinine peaked at 1.8, improved today 1.26 -Continue renal dosing meds  Diabetes, type II -A1c 8.6 -Apparently not on current medication at home -developed hyperglycemia after starting tube feeds, change tube feeds to glucerna, increase lantus to 10untis bid, continue ssi   Hypothyroidism TSH 6, started on Synthroid  Acute metabolic encephalopathy CT head no acute findings, Nonspecific right greater than left mastoid and middle ear Opacification Start trial of augmentin per tube  FTT, poor prognosis Confirmed DNR status -continue goals of care discussion  palliative care input appreciated   DVT prophylaxis: enoxaparin (LOVENOX) injection 40 mg Start: 07/20/20 2030   Code Status: Family Communication: Daughter at bedside Disposition:   Status is: Inpatient   Dispo: The patient is from: home              Anticipated d/c is to: TBD              Anticipated d/c date is: TBD              Patient currently not medically ready to discharge, remain significant hypoxia, on nonrebreather  Consultants:  Cardiology, has signed off  Palliative care  Procedures:   Core track feeding tube placement on 2/2  Antimicrobials:   Augmentin from February 2     Objective: Vitals:   2020-07-29 0400 2020/07/29 0500 07-29-2020 0600 07-29-2020 0733  BP:     128/73  Pulse:    99  Resp:    20  Temp:    99 F (37.2 C)  TempSrc:    Axillary  SpO2: 91%  96% 90%  Weight:  81 kg    Height:        Intake/Output Summary (Last 24 hours) at Jul 29, 2020 0739 Last data filed at 2020-07-29 5697 Gross per 24 hour  Intake 1953.75 ml  Output 1050 ml  Net 903.75 ml   Filed Weights   07/19/20 0448 07/20/20 0500 Jul 29, 2020 0500  Weight: 80.9 kg 81 kg 81 kg    Examination:  General exam: Very lethargic, open eyes to voice briefly Respiratory system: Diminished, but no wheezing, no rales no rhonchi.  Intermittent accessory muscle use Cardiovascular system: S1 & S2 heard, RRR.  Gastrointestinal system: Abdomen is nondistended, soft and nontender. Normal bowel sounds heard. Central nervous system: Very lethargic Extremities: Generalized weakness, trace edema bilateral lower extremity Skin: No rashes, lesions or ulcers Psychiatry: Very lethargic    Data Reviewed: I have personally reviewed following labs and imaging studies  CBC: Recent Labs  Lab 07/15/20 0721 07/16/20 0328 07/17/20 0319 07/20/20 0134 07/29/20 0037  WBC 6.5 7.0 8.2 8.7 7.8  NEUTROABS 5.8 6.2 7.5 8.1* 7.2  HGB 14.3 13.4 13.1 13.8 13.7  HCT 42.5 40.0 38.8 40.9 41.1  MCV 91.6 91.3 91.7 91.5 91.1  PLT 213 198 199 214 204    Basic Metabolic Panel: Recent Labs  Lab 07/16/20 0328 07/17/20 0319 07/18/20 0237 07/19/20 0500 07/20/20 0134 07/20/20 0733 07/20/20 1621 07-29-20 0037 07-29-2020 0423  NA 139 141 140  --  143  --   --  140  --   K 3.6 3.4* 3.4*  --  3.4*  --   --  3.9  --   CL 95* 97* 98  --  100  --   --  96*  --   CO2 31 30 26   --  28  --   --  30  --   GLUCOSE 186* 189* 254*  --  198*  --   --  376* 435*  BUN 33* 29* 25*  --  22  --   --  31*  --   CREATININE 1.80* 1.54* 1.37* 1.34* 1.26*  --   --  1.43*  --   CALCIUM 7.9* 8.1* 8.1*  --  8.6*  --   --  8.7*  --   MG 1.9 2.0  --   --  2.3 2.3 2.4 2.4  --   PHOS 3.2 2.9  --   --   --  2.7 2.1* 1.6*  --      GFR: Estimated Creatinine Clearance: 27.6 mL/min (A) (by C-G formula based on SCr of 1.43 mg/dL (H)).  Liver Function Tests: Recent Labs  Lab 07/15/20 0721 07/16/20 0328 07/17/20 0319 07/20/20 0134 Jul 29, 2020 0037  AST 57* 62* 54* 47* 49*  ALT 32 31 29 30  32  ALKPHOS 47 52 58 83 106  BILITOT 1.0 1.1 1.0 1.4* 1.3*  PROT 7.3 6.8 6.6 6.7 6.7  ALBUMIN 3.5 3.3* 3.2* 3.6 3.3*    CBG: Recent Labs  Lab 07/20/20 1236 07/20/20 1620 07/20/20 2016 07/20/20 2354   0411  GLUCAP 227* 262* 290* 298* 425*     Recent Results (from the past 240 hour(s))  SARS Coronavirus 2 by RT PCR (hospital order, performed in [facility] hospital lab) Nasopharyngeal Nasopharyngeal Swab     Status: Abnormal   Collection Time: 07/10/2020  3:52 PM   Specimen: Nasopharyngeal Swab  Result Value Ref Range Status   SARS Coronavirus 2 POSITIVE (A) NEGATIVE Final    Comment: RESULT CALLED TO, READ BACK BY AND VERIFIED WITH: Donnetta Hail RN 1720 07/10/2020 A BROWNING (NOTE) SARS-CoV-2 target nucleic acids are DETECTED  SARS-CoV-2 RNA is generally detectable in upper respiratory specimens  during the acute phase of infection.  Positive results are indicative  of the presence of the identified virus, but do not rule out bacterial infection or co-infection with other pathogens not detected by the test.  Clinical correlation with patient history and  other diagnostic information is necessary to determine patient infection status.  The expected result is negative.  Fact Sheet for Patients:   BoilerBrush.com.cy   Fact Sheet for Healthcare Providers:   https://pope.com/    This test is not yet approved or cleared by the [place] FDA and  has been authorized for detection and/or diagnosis of SARS-CoV-2 by FDA under an Emergency Use Authorization (EUA).  This EUA will remain in effect (meaning this t est can be used) for the duration of  the COVID-19  declaration under Section 564(b)(1) of the Act, 21 U.S.C. section 360-bbb-3(b)(1), unless the authorization is terminated or revoked sooner.  Performed at [facility], [address], [place], [place] [address]   Blood Culture (routine x 2)     Status: None   Collection Time: 07/07/2020  5:34 PM   Specimen: BLOOD  Result Value Ref Range Status   Specimen Description BLOOD LEFT ANTECUBITAL  Final   Special Requests   Final    BOTTLES DRAWN AEROBIC AND ANAEROBIC Blood Culture results may not be optimal due to an inadequate volume of blood received in culture bottles   Culture   Final    NO GROWTH 5 DAYS Performed at [facility], [address], [place], [place] [address]    Report Status 07/17/2020 FINAL  Final         Radiology Studies: DG CHEST PORT 1 VIEW  Result Date: 07/20/2020 CLINICAL DATA:  Shortness of breath EXAM: PORTABLE CHEST 1 VIEW COMPARISON:  07/04/2020 FINDINGS: Hazy bilateral airspace disease with progressive density. No visible effusion or pneumothorax. Cardiomegaly. Feeding tube which at least reaches the mid stomach. Extensive artifact from EKG leads. IMPRESSION: Progressive bilateral airspace disease. Electronically Signed   By: Marnee Spring M.D.   On: 07/20/2020 09:09        Scheduled Meds: . amoxicillin-clavulanate  500 mg Per Tube Q12H  . baricitinib  2 mg Per Tube Daily  . enoxaparin (LOVENOX) injection  40 mg Subcutaneous Q24H  . feeding supplement (PROSource TF)  45 mL Per Tube Daily  . free water  200 mL Per Tube Q8H  . hydrALAZINE  10 mg Intravenous Q8H  . insulin aspart  0-9 Units Subcutaneous Q4H  . insulin glargine  10 Units Subcutaneous BID  . levothyroxine  12.5 mcg Intravenous Daily  . methylPREDNISolone (SOLU-MEDROL) injection  40 mg Intravenous Q24H  . phosphorus  250 mg Per Tube BID  . sodium chloride flush  3 mL Intravenous Q12H  . sodium chloride flush  3 mL Intravenous Q12H   Continuous  Infusions: . sodium  chloride    . feeding supplement (OSMOLITE 1.2 CAL) 1,000 mL (07/20/20 0802)     LOS: 9 days   Time spent: 15 palliative care  I have discussed plan of care as described above with RN , patient and family on 07/30/2020  Voice Recognition /Dragon dictation system was used to create this note, attempts have been made to correct errors. Please contact the author with questions and/or clarifications.   Albertine Grates, MD PhD FACP [facility]  Available via Epic secure chat 7am-7pm for nonurgent issues Please page for urgent issues To page the attending provider between 7A-7P or the covering provider during after hours 7P-7A, please log into the web site [email or web address] and access using universal [place] password for that web site. If you do not have the password, please call the hospital operator.    08/07/2020, 7:39 AM

## 2020-08-15 NOTE — Progress Notes (Addendum)
Pt exhibiting increased oxygen requirements this AM.   On 40L HHFNC 100% FiO2+ NRB mask, with O2 sats 78-83%. Increased O2 to 45L, then 50 and 55L with no improvement. Unable to obtain accurate O2 saturation waveform. Fingers, earlobes, forehead, and toes used On 60L HHFNC at 100% FiO2, O2 saturation around 84%.  Left a voicemail for patient's daughter, letting her know that her mother is on maximum oxygen requirements right now and is still having poor saturations. As of 2/3 (yesterday), her daughter is planning on visiting around 1pm today.  MD and respiratory therapy aware.  Additionally, pt's CBG this AM was 409. Notified Dr.Xu, and gave 9 units Novolog per SSI. Repeated CBG in was 430. Per MD, gave the ordered 10 units Lantus and paused tube feedings until new Glucerna feeding is tubed to 5W.

## 2020-08-15 NOTE — Progress Notes (Signed)
Nutrition Follow-up  DOCUMENTATION CODES:   Obesity unspecified  INTERVENTION:  No documented BM since 1/24, recommend initiation of bowel regimen.  Continue TF via Cortrak: -Glucerna 1.5 @ 28ml/hr (1235ml) -122ml free water Q4H -d/c Prosource TF  TF provides 1800kcals, 99 grams protein, 930ml free water (1823ml total free water with flushes) Meets 100% of needs  NUTRITION DIAGNOSIS:   Inadequate oral intake related to inability to eat as evidenced by NPO status.  ongoing  GOAL:   Patient will meet greater than or equal to 90% of their needs  Met with TF  MONITOR:   TF tolerance,Weight trends,Labs,I & O's  REASON FOR ASSESSMENT:   Consult,Malnutrition Screening Tool Enteral/tube feeding initiation and management  ASSESSMENT:   Pt admitted with acute hypoxic respiratory failure 2/2 COVID-19 PNA and pulmonary edema 2/2 CHF. PMH includes thyroid disease.   2/2 Cortrak placed (gastric)  MD consulted RD to change TF to Glucerna due to elevated CBGs. Note TF order was changed by MD this morning shortly after consult was placed, prior to when pt could be seen/re-assessed by RD. MD ordered the following to be given via Cortrak: Glucerna 1.5 @ 58ml/hr advance 60ml/hr Q8H to goal of 71ml/hr, 85ml Prosource TF daily, 237ml free water Q8H. Per RN, this was started around lunch time today. Rate at 26ml/hr currently. At goal, TF ordered by MD will provide 2200 kcals, 130 grams protein, 103ml free water. This provides significantly more kcals/protein than what pt is estimated to need. Will adjust TF orders to better meet pt's needs.   PMT continues to follow pt and notes pt's continued decline. Pt's daughter has expressed a desire for comfort care, but is not yet ready to withdraw interventions. Family is planning to visit pt today. [place] discussions ongoing. PMT notes likely hospital death.   UOP: 1043ml x 24 hours   Admission weight: 75.1 kg Current weight: 81 kg  Per RN  assessment, pt with nonpitting edema to BUE.  Note, no BM documented since 1/24. Recommend initiation of bowel regimen.   Labs: PO4 1.6 (L), CBGs 290-425 (Diabetes Coordinator following) Medications: ss noovlog Q4H, 10 units lantus BID, solu-medrol, K phos neutral  Diet Order:   Diet Order            Diet NPO time specified Except for: Ice Chips  Diet effective now                 EDUCATION NEEDS:   No education needs have been identified at this time  Skin:  Skin Assessment: Reviewed RN Assessment  Last BM:  "UTA" per RN?  Height:   Ht Readings from Last 1 Encounters:  07/14/20 5' (1.524 m)    Weight:   Wt Readings from Last 1 Encounters:  August 08, 2020 81 kg   BMI:  Body mass index is 34.88 kg/m.  Estimated Nutritional Needs:   Kcal:  1800-2000  Protein:  90-105 grams  Fluid:  >1.8L    Larkin Ina, MS, RD, LDN RD pager number and weekend/on-call pager number located in [place].

## 2020-08-15 NOTE — Death Summary Note (Signed)
Death Summary  Kaitlyn Novak GQQ:[id number] DOB: 04/14/1934  PCP: Patient, No Pcp Per  Admit date: Jul 16, 2020 Time of Death:10:35pm 07-25-20  I was not present at time of death  Discharge Diagnoses:  Active Hospital Problems   Diagnosis Date Noted  . Acute respiratory failure with hypoxia (HCC) 16-Jul-2020  . Acute systolic heart failure (HCC)   . Encephalopathy   . Palliative care by specialist   . Goals of care, counseling/discussion   . Volume overload Jul 16, 2020  . COVID-19 virus infection Jul 16, 2020  . Troponin level elevated 2020-07-16  . Creatinine elevation 07-16-2020    Resolved Hospital Problems  No resolved problems to display.      Filed Weights   07/19/20 0448 07/20/20 0500 2020/07/25 0500  Weight: 80.9 kg 81 kg 81 kg    History of present illness: (Per admitting MD Dr. Alinda Money) Patient coming from: [facility] physicians office, was there to establish care   Chief Complaint: Shortness of breath, hypoxia  HPI: Kaitlyn Novak is a 87 y.o. female with unclear past medical history (?thyroid disease) who presents from [facility] primary care due to respiratory distress and hypoxia.             Patient recently moved in with her daughter from [place] over the summer.  Patient reportedly saw a doctor in [place] but is unable to list any medical conditions that she has her medications she takes.  Per her daughter she only takes a daily vitamin at this time.             She went to [facility] physicians to present for slowly worsening shortness of breath per her daughter for the past month and to establish care and was found to be hypoxic into the 70s with increased work of breathing.  EMS was contacted so the patient could transport to the ED and she was started on 15 L nonrebreather and saturating 100%.  As above daughter states she has had some slowly worsening shortness of breath for about a month.  Daughter also reports patient's memory and mentation has been slowly worsening for  at least several months.  Denies fevers, cough, chest pain, abdominal pain, nausea, vomiting.  ED Course: Vital signs in ED significant for requiring 5 to 6 L to maintain saturations in the 90s.  Vital signs otherwise stable.  Lab work-up showed CMP with creatinine of 1.22 with unclear baseline, glucose 188, calcium 8.5 which corrects considering albumin of 3.3, AST 64, WBC 2.7 on CBC along with platelets of 96.  Troponin I 51 on initial check with repeat pending.  BMP pending.  Lactic acid normal with repeat pending.  Positive for Covid.  ABG with pH 7.43 and PCO2 49.  Blood cultures pending.  Procalcitonin 2.1, LDH 600, triglycerides 167, CRP and ferritin pending.  Patient started on remdesivir, steroids and given a dose of Lasix in the ED.   Hospital Course:  Principal Problem:   Acute respiratory failure with hypoxia (HCC) Active Problems:   Volume overload   COVID-19 virus infection   Troponin level elevated   Creatinine elevation   Acute systolic heart failure (HCC)   Encephalopathy   Palliative care by specialist   Goals of care, counseling/discussion   Acute hypoxic respiratory failure, Covid 19 pneumonia, pulmonary edema from heart failure   COVID-19 pneumonia Received remdesivir, on IV Decadron, started on baricitinib January 28 -CRP and D-dimer appear trending down, - she is not improving with severe hypoxia/labored breathing despite heated highflow/NRB oxygen,and maximus  medical treatment -critical care consulted and recommended palliative care, daughter confirmed DNR status, daughter requested temporary feeding tube to see if it helps, feeding tube placed, patient continue to decline, daughter made aware on 2/4 am , daughter was able to get her children to come visit on 2/4 pm, patient passed away 2/4 night    Acute systolic heart failure exacerbation -Previous documented patient volume overloaded with lower extremity edema and pulmonary edema -Left ventricular EF 35%,  cardiology consulted, -She received Lasix from January 26 to January 29, Lasix held due to AKI -Received extra dose of Lasix then  creatinine got worse    Mild elevation of troponin -No chest pain -Likely related to Covid versus heart failure -Cardiology consulted during hospital stay  Hypokalemia, replaced Hypophosphatemia: replaced   AKI on CKD 3B -Creatinine peaked at 1.8, improved today 1.26, then worsened    Diabetes, type II -A1c 8.6 -Apparently not on current medication at home -treated with insulin   Hypothyroidism TSH 6, treated with Synthroid  Acute metabolic encephalopathy CT head no acute findings, Nonspecific right greater than left mastoid and middle ear Opacification Treated with a  trial of augmentin per tube    Consultants:   Cardiology, has signed off  Palliative care  Procedures:   Core track feeding tube placement on 2/2  Antimicrobials:   Augmentin from February 2    Allergies as of 08/02/2020   No Known Allergies     Medication List    STOP taking these medications   Multivitamin Adult Tabs      No Known Allergies    The results of significant diagnostics from this hospitalization (including imaging, microbiology, ancillary and laboratory) are listed below for reference.    Significant Diagnostic Studies: CT HEAD WO CONTRAST  Result Date: 07/18/2020 CLINICAL DATA:  Delirium EXAM: CT HEAD WITHOUT CONTRAST TECHNIQUE: Contiguous axial images were obtained from the base of the skull through the vertex without intravenous contrast. COMPARISON:  None. FINDINGS: Brain: There is no acute intracranial hemorrhage, mass effect, or edema. Gray-white differentiation is preserved. There is no extra-axial fluid collection. Ventricles and sulci are within normal limits in size and configuration. Vascular: There is atherosclerotic calcification at the skull base. Skull: Calvarium is unremarkable. Sinuses/Orbits: Mild mucosal  thickening. Other: Right greater than left mastoid opacification. Right middle ear opacification. IMPRESSION: No acute intracranial abnormality. Nonspecific right greater than left mastoid and middle ear opacification. Electronically Signed   By: Guadlupe SpanishPraneil  Patel M.D.   On: 07/18/2020 19:53   DG CHEST PORT 1 VIEW  Result Date: 07/20/2020 CLINICAL DATA:  Shortness of breath EXAM: PORTABLE CHEST 1 VIEW COMPARISON:  2020/08/14 FINDINGS: Hazy bilateral airspace disease with progressive density. No visible effusion or pneumothorax. Cardiomegaly. Feeding tube which at least reaches the mid stomach. Extensive artifact from EKG leads. IMPRESSION: Progressive bilateral airspace disease. Electronically Signed   By: Marnee SpringJonathon  Watts M.D.   On: 07/20/2020 09:09   DG Chest Port 1 View  Result Date: 05-Jun-2021 CLINICAL DATA:  Short of breath EXAM: PORTABLE CHEST 1 VIEW COMPARISON:  None. FINDINGS: Single frontal view of the chest demonstrates an enlarged cardiac silhouette. There is diffuse increased interstitial prominence, with patchy areas of bilateral airspace disease seen at the lung bases. Trace left effusion is suspected. No pneumothorax. IMPRESSION: 1. Constellation of findings most suggestive of congestive heart failure. Electronically Signed   By: Sharlet SalinaMichael  Brown M.D.   On: 2020/08/14 14:55   ECHOCARDIOGRAM LIMITED  Result Date: 07/13/2020    ECHOCARDIOGRAM  LIMITED REPORT   Patient Name:   Kaitlyn Novak Date of Exam: 07/13/2020 Medical Rec #:  [id number]      Height: Accession #:    [id number]     Weight: Date of Birth:  1933-11-05      BSA: Patient Age:    86 years       BP:           127/78 mmHg Patient Gender: F              HR:           70 bpm. Exam Location:  Inpatient Procedure: Limited Echo, Cardiac Doppler and Color Doppler Indications:    Dyspnea  History:        Patient has no prior history of Echocardiogram examinations.                 Signs/Symptoms:Shortness of Breath; Risk Factors:Diabetes.                  COVID+, Resp. distress.  Sonographer:    Lavenia Atlas Referring Phys: [id number] Cecille Po MELVIN  Sonographer Comments: Technically challenging study due to limited acoustic windows. COVID+ IMPRESSIONS  1. Left ventricular ejection fraction, by estimation, is 30 to 35%. The left ventricle has moderately decreased function. The left ventricle demonstrates global hypokinesis. There is mild left ventricular hypertrophy. Left ventricular diastolic parameters are consistent with Grade I diastolic dysfunction (impaired relaxation).  2. Right ventricular systolic function is moderately reduced. The right ventricular size is normal. There is normal pulmonary artery systolic pressure. The estimated right ventricular systolic pressure is 23.8 mmHg.  3. The aortic valve is tricuspid. Aortic valve regurgitation is not visualized. Mild aortic valve sclerosis is present, with no evidence of aortic valve stenosis.  4. No evidence of mitral valve regurgitation. No evidence of mitral stenosis.  5. The inferior vena cava is normal in size with greater than 50% respiratory variability, suggesting right atrial pressure of 3 mmHg.  6. A small pericardial effusion is present. FINDINGS  Left Ventricle: Left ventricular ejection fraction, by estimation, is 30 to 35%. The left ventricle has moderately decreased function. The left ventricle demonstrates global hypokinesis. The left ventricular internal cavity size was normal in size. There is mild left ventricular hypertrophy. Left ventricular diastolic parameters are consistent with Grade I diastolic dysfunction (impaired relaxation). Right Ventricle: The right ventricular size is normal. No increase in right ventricular wall thickness. Right ventricular systolic function is moderately reduced. There is normal pulmonary artery systolic pressure. The tricuspid regurgitant velocity is 2.28 m/s, and with an assumed right atrial pressure of 3 mmHg, the estimated right ventricular  systolic pressure is 23.8 mmHg. Pericardium: A small pericardial effusion is present. Mitral Valve: There is mild calcification of the mitral valve leaflet(s). Mild mitral annular calcification. No evidence of mitral valve stenosis. Tricuspid Valve: The tricuspid valve is normal in structure. Tricuspid valve regurgitation is trivial. Aortic Valve: The aortic valve is tricuspid. Aortic valve regurgitation is not visualized. Mild aortic valve sclerosis is present, with no evidence of aortic valve stenosis. Aortic valve peak gradient measures 3.3 mmHg. Aorta: The aortic root is normal in size and structure. Venous: The inferior vena cava is normal in size with greater than 50% respiratory variability, suggesting right atrial pressure of 3 mmHg. LEFT VENTRICLE PLAX 2D LVIDd:         4.00 cm      Diastology LVIDs:         2.80 cm  LV e' medial:    4.35 cm/s LV PW:         1.30 cm      LV E/e' medial:  10.7 LV IVS:        1.30 cm      LV e' lateral:   4.68 cm/s LVOT diam:     2.00 cm      LV E/e' lateral: 10.0 LVOT Area:     3.14 cm  LV Volumes (MOD) LV vol d, MOD A4C: 111.0 ml LV vol s, MOD A4C: 47.4 ml LV SV MOD A4C:     111.0 ml RIGHT VENTRICLE RV S prime:     5.11 cm/s LEFT ATRIUM LA diam:    2.50 cm  AORTIC VALVE AV Vmax:      90.80 cm/s AV Peak Grad: 3.3 mmHg  AORTA Ao Root diam: 2.80 cm MITRAL VALVE               TRICUSPID VALVE MV Area (PHT): 3.85 cm    TR Peak grad:   20.8 mmHg MV Decel Time: 197 msec    TR Vmax:        228.00 cm/s MV E velocity: 46.70 cm/s MV A velocity: 70.40 cm/s  SHUNTS MV E/A ratio:  0.66        Systemic Diam: 2.00 cm Marca Ancona MD Electronically signed by Marca Ancona MD Signature Date/Time: 07/13/2020/10:47:24 PM    Final     Microbiology: Recent Results (from the past 240 hour(s))  Blood Culture (routine x 2)     Status: None   Collection Time: 07-15-2020  5:34 PM   Specimen: BLOOD  Result Value Ref Range Status   Specimen Description BLOOD LEFT ANTECUBITAL  Final    Special Requests   Final    BOTTLES DRAWN AEROBIC AND ANAEROBIC Blood Culture results may not be optimal due to an inadequate volume of blood received in culture bottles   Culture   Final    NO GROWTH 5 DAYS Performed at [facility], [address], [place], [place] [address]    Report Status 07/17/2020 FINAL  Final     Labs: Basic Metabolic Panel: Recent Labs  Lab 07/16/20 0328 07/17/20 0319 07/18/20 0237 07/19/20 0500 07/20/20 0134 07/20/20 0733 07/20/20 1621 07/31/2020 0037 07/28/2020 0423 07/20/2020 1649  NA 139 141 140  --  143  --   --  140  --   --   K 3.6 3.4* 3.4*  --  3.4*  --   --  3.9  --   --   CL 95* 97* 98  --  100  --   --  96*  --   --   CO2 31 30 26   --  28  --   --  30  --   --   GLUCOSE 186* 189* 254*  --  198*  --   --  376* 435*  --   BUN 33* 29* 25*  --  22  --   --  31*  --   --   CREATININE 1.80* 1.54* 1.37* 1.34* 1.26*  --   --  1.43*  --   --   CALCIUM 7.9* 8.1* 8.1*  --  8.6*  --   --  8.7*  --   --   MG 1.9 2.0  --   --  2.3 2.3 2.4 2.4  --  2.6*  PHOS 3.2 2.9  --   --   --  2.7 2.1* 1.6*  --  2.4*   Liver Function Tests: Recent Labs  Lab 07/16/20 0328 07/17/20 0319 07/20/20 0134 2020/08/06 0037  AST 62* 54* 47* 49*  ALT 31 29 30  32  ALKPHOS 52 58 83 106  BILITOT 1.1 1.0 1.4* 1.3*  PROT 6.8 6.6 6.7 6.7  ALBUMIN 3.3* 3.2* 3.6 3.3*   No results for input(s): LIPASE, AMYLASE in the last 168 hours. No results for input(s): AMMONIA in the last 168 hours. CBC: Recent Labs  Lab 07/16/20 0328 07/17/20 0319 07/20/20 0134 08-06-20 0037  WBC 7.0 8.2 8.7 7.8  NEUTROABS 6.2 7.5 8.1* 7.2  HGB 13.4 13.1 13.8 13.7  HCT 40.0 38.8 40.9 41.1  MCV 91.3 91.7 91.5 91.1  PLT 198 199 214 204   Cardiac Enzymes: No results for input(s): CKTOTAL, CKMB, CKMBINDEX, TROPONINI in the last 168 hours. BNP: BNP (last 3 results) Recent Labs    06/20/2020 1552 07/20/20 0134  BNP 134.8* 458.9*    ProBNP (last 3 results) No results for input(s): PROBNP  in the last 8760 hours.  CBG: Recent Labs  Lab 07/20/20 1236 07/20/20 1620 07/20/20 2016 07/20/20 2354 Aug 06, 2020 0411  GLUCAP 227* 262* 290* 298* 425*       Signed:  09/18/20 MD, PhD, FACP  [facility] 08/02/2020, 4:23 PM

## 2020-08-15 NOTE — Progress Notes (Signed)
Per tele, pt HR decrease and monitor tech could not get an accurate reading. RN went into pt's room and found pt expired with no pulse nor respirations. Pt also did not respond to verbal nor physical stimuli. RN called CN and CN agreed with RN's assessment. MD notified. See Port-Mortem flowsheet.

## 2020-08-15 NOTE — Progress Notes (Signed)
Now, with our most accurate waveform reading, patient's O2 sats are about 95% on 60L at 100% FiO2 plus nonrebreather mask. Slowly decreased oxygen back to 40L and remains in low-mid 90's.   Will continue to monitor O2 sats.

## 2020-08-15 NOTE — Progress Notes (Signed)
Daily Progress Note   Patient Name: Kaitlyn Novak       Date: 07/29/2020 DOB: 1934/03/20  Age: 87 y.o. MRN#: [id number] Attending Physician: Albertine Grates, MD Primary Care Physician: Patient, No Pcp Per Admit Date: 2020-07-15  Reason for Consultation/Follow-up: Establishing goals of care  Subjective: Patient lethargic but appears comfortable. No tachypnea but slight accessory muscle use. De-satted this morning requiring increase to 60L heated high flow and also remains on 15L NRB mask.   GOC:  Spoke with daughter. Provided update for the day and concern with ongoing decline and oxygen requirements. Daughter tearful. She verbalizes to "keep mom comfortable" but not ready to decrease oxygen requirements. We talked about allowing her children (patient's grandchildren) to visit today. Encouraged her to consider titrating down oxygen once family has a chance to visit, with concern that this will continue to prolong her suffering. Expressed that her mother will likely pass quickly even if half of the oxygen is removed. Emotional support provided. Answered questions.   Discussed with RN and Dr. Roda Shutters.    Length of Stay: 9  Current Medications: Scheduled Meds:  . amoxicillin-clavulanate  500 mg Per Tube Q12H  . baricitinib  2 mg Per Tube Daily  . enoxaparin (LOVENOX) injection  40 mg Subcutaneous Q24H  . feeding supplement (PROSource TF)  45 mL Per Tube Daily  . free water  200 mL Per Tube Q8H  . hydrALAZINE  10 mg Intravenous Q8H  . insulin aspart  0-9 Units Subcutaneous Q4H  . insulin glargine  10 Units Subcutaneous BID  . levothyroxine  12.5 mcg Intravenous Daily  . methylPREDNISolone (SOLU-MEDROL) injection  40 mg Intravenous Q24H  . phosphorus  250 mg Per Tube BID  . sodium chloride flush  3 mL  Intravenous Q12H  . sodium chloride flush  3 mL Intravenous Q12H    Continuous Infusions: . sodium chloride    . feeding supplement (GLUCERNA 1.5 CAL)      PRN Meds: sodium chloride, acetaminophen, chlorpheniramine-HYDROcodone, guaiFENesin-dextromethorphan, haloperidol lactate, lip balm, morphine injection, polyethylene glycol, sodium chloride flush  Physical Exam Vitals and nursing note reviewed.  Constitutional:      Appearance: She is ill-appearing.  Cardiovascular:     Rate and Rhythm: Normal rate.  Pulmonary:     Effort: Accessory muscle usage present. No tachypnea or respiratory  distress.     Breath sounds: Decreased breath sounds present.     Comments: Heated high flow and NRB mask 15L  Neurological:     Mental Status: She is easily aroused.     Comments: Will wake to voice, does not engage. Nonverbal.  Psychiatric:        Attention and Perception: She is inattentive.        Speech: She is noncommunicative.        Cognition and Memory: Cognition is impaired.            Vital Signs: BP 128/73 (BP Location: Left Arm)   Pulse 96   Temp 99 F (37.2 C) (Axillary)   Resp 13   Ht 5' (1.524 m)   Wt 81 kg   SpO2 91%   BMI 34.88 kg/m  SpO2: SpO2: 91 % O2 Device: O2 Device: High Flow Nasal Cannula,NRB O2 Flow Rate: O2 Flow Rate (L/min): 50 L/min (50L HHFNC/15L NRB)  Intake/output summary:   Intake/Output Summary (Last 24 hours) at 07/29/20 1106 Last data filed at 2020/07/29 9449 Gross per 24 hour  Intake 1953.75 ml  Output 1050 ml  Net 903.75 ml   LBM: Last BM Date:  (UTA) Baseline Weight: Weight: 75.1 kg Most recent weight: Weight: 81 kg       Palliative Assessment/Data: PPS 20%    Flowsheet Rows   Flowsheet Row Most Recent Value  Intake Tab   Referral Department Hospitalist  Unit at Time of Referral Cardiac/Telemetry Unit  Palliative Care Primary Diagnosis Sepsis/Infectious Disease  Palliative Care Type New Palliative care  Reason for referral Clarify  Goals of Care  Date first seen by Palliative Care 07/19/20  Clinical Assessment   Palliative Performance Scale Score 20%  Psychosocial & Spiritual Assessment   Palliative Care Outcomes   Patient/Family meeting held? Yes  Who was at the meeting? daughter Rinaldo Cloud)  Palliative Care Outcomes Clarified goals of care, Provided psychosocial or spiritual support, ACP counseling assistance, Provided end of life care assistance      Patient Active Problem List   Diagnosis Date Noted  . Acute systolic heart failure (HCC)   . Encephalopathy   . Palliative care by specialist   . Goals of care, counseling/discussion   . Acute respiratory failure with hypoxia (HCC) 06/22/2020  . Volume overload 07/06/2020  . COVID-19 virus infection 07/13/2020  . Troponin level elevated 06/25/2020  . Creatinine elevation 06/27/2020    Palliative Care Assessment & Plan   Patient Profile: 87 y.o. female  with past medical history of thyroid disease, recently moved from [place] in August 2021 to live in [place] with her daughter admitted on 06/19/2020 from PCP appointment due to respiratory distress and hypoxia, oxygen in the 70's. Hospital admission for acute hypoxic respiratory failure secondary to COVID 19 pneumonia, pulmonary edema from acute systolic CHF with EF 35%, AKI on CKD, DM type II, acute metabolic encephalopathy with negative CT head, and overall failure to thrive. Patient's condition remains tenuous requiring 15L HFNC and 15L NRB mask. Remains encephalopathic with poor oral intake due to NPO recommendation. Palliative medicine consultation for goals of care.   Assessment: Acute hypoxic respiratory failure COVID 19 pneumonia Pulmonary edema Systolic CHF with EF 35% Type II DM Acute metabolic encephalopathy Adult failure to thrive  Recommendations/Plan:  Patient continues to decline. Daughter speaks of her wish for comfort but not ready to discontinue interventions such as oxygen. Allow patient's  grandchildren to visit today. Encourage daughter consider titrating oxygen down  once family visits, as this will only unfortunately continue to prolong the process.  Continue current plan of care and medical management. Tenuous.   Daughter agreeable with addition of prn morphine to [facility] for dyspnea/tachypnea/pain.  Unrestricted visitor access. Allow grandchildren to visit.   Code Status: DNR   Code Status Orders  (From admission, onward)         Start     Ordered   07/15/20 1404  Do not attempt resuscitation (DNR)  Continuous       Question Answer Comment  In the event of cardiac or respiratory ARREST Do not call a "code blue"   In the event of cardiac or respiratory ARREST Do not perform Intubation, CPR, defibrillation or ACLS   In the event of cardiac or respiratory ARREST Use medication by any route, position, wound care, and other measures to relive pain and suffering. May use oxygen, suction and manual treatment of airway obstruction as needed for comfort.      07/15/20 1403        Code Status History    Date Active Date Inactive Code Status Order ID Comments User Context   06/21/2020 2024 07/15/2020 1403 Full Code [id number]  Synetta Fail, MD ED   Advance Care Planning Activity       Prognosis:   Tenuous   Discharge Planning:  Likely hospital death,  Care plan was discussed with RN, Dr Roda Shutters, daughter  Thank you for allowing the Palliative Medicine Team to assist in the care of this patient.   Total Time 30 Prolonged Time Billed no    Greater than 50% of this time was spent counseling and coordinating care related to the above assessment and plan.   Vennie Homans, DNP, FNP-C Palliative Medicine Team  Phone: [phone] Fax: [phone]  Please contact Palliative Medicine Team phone at [phone] for questions and concerns.

## 2020-08-15 NOTE — Progress Notes (Signed)
Inpatient Diabetes Program Recommendations  AACE/ADA: New Consensus Statement on Inpatient Glycemic Control (2015)  Target Ranges:  Prepandial:   less than 140 mg/dL      Peak postprandial:   less than 180 mg/dL (1-2 hours)      Critically ill patients:  140 - 180 mg/dL   Lab Results  Component Value Date   GLUCAP 425 (H) 08/05/2020   HGBA1C 8.6 (H) 07/13/2020    Review of Glycemic Control Results for Kaitlyn Novak, Kaitlyn Novak (MRN [id number]) as of 08/02/2020 09:17  Ref. Range 07/19/2020 17:12 07/19/2020 20:27 07/20/2020 07:52 07/20/2020 12:36 07/20/2020 16:20 07/20/2020 20:16 07/20/2020 23:54 08/09/2020 04:11  Glucose-Capillary Latest Ref Range: 70 - 99 mg/dL 093 (H) 235 (H) 573 (H) 227 (H) 262 (H) 290 (H) 298 (H) 425 (H)    Current orders for Inpatient glycemic control:  Lantus 10 units BID (increased today) Novolog 0-9 units Q4H Solumedrol 40 mg daily Osmolite @ 65 ml/hr continuous  Inpatient Diabetes Program Recommendations:    Please consider Novolog 5 units Q4H tube feed coverage.  Stop if feeds are held or discontinued.    Will continue to follow while inpatient.  Thank you, Dulce Sellar, RN, BSN Diabetes Coordinator Inpatient Diabetes Program [phone] (team pager from 8a-5p)

## 2020-08-15 DEATH — deceased

## 2021-08-04 IMAGING — CT CT HEAD W/O CM
3 series · 15 of 47 positions shown, 18 images · non-contrast
Comparison: None.

CLINICAL DATA: Delirium

EXAM:
CT HEAD WITHOUT CONTRAST
TECHNIQUE: Contiguous axial images were obtained from the base of the skull
through the vertex without intravenous contrast.

[Series 3: head 5.0 h30s · axial · 0.43mm/px · z∈[-224,-84]mm · 9 of 34 slices shown, 12 images]
[im 3/34  brain]
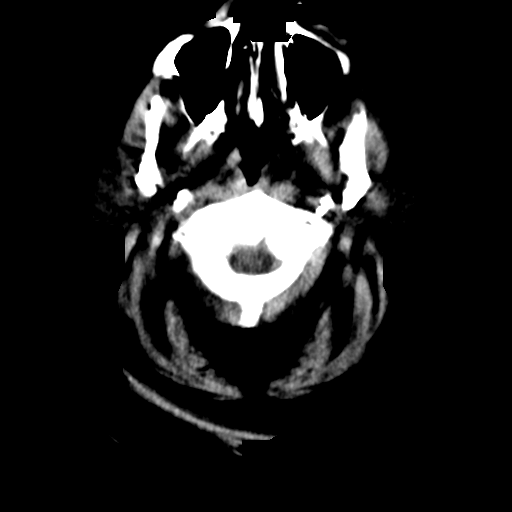
[im 3/34  bone]
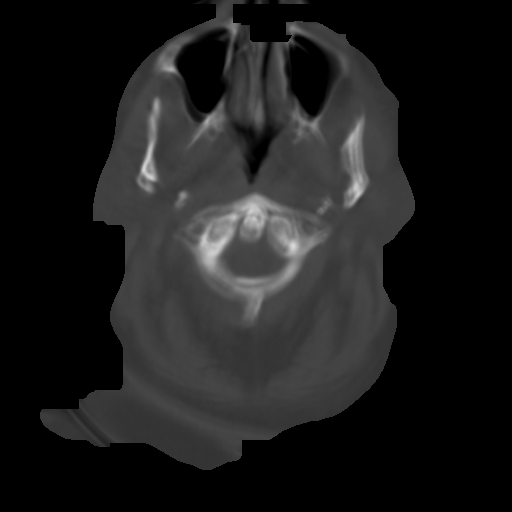
[im 6/34  brain]
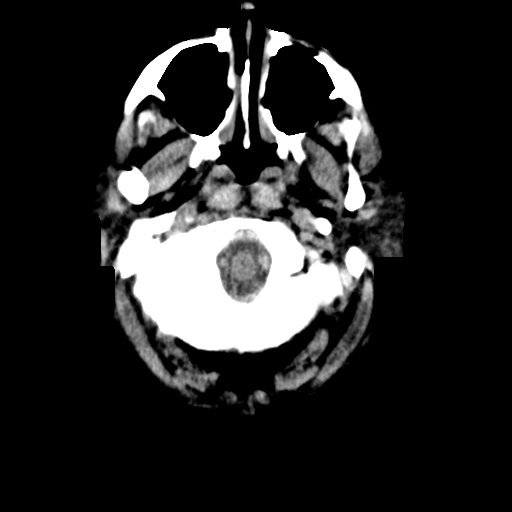
[im 10/34  brain]
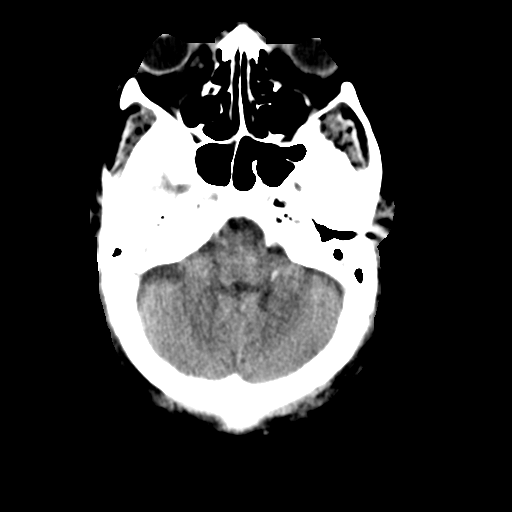
[im 13/34  brain]
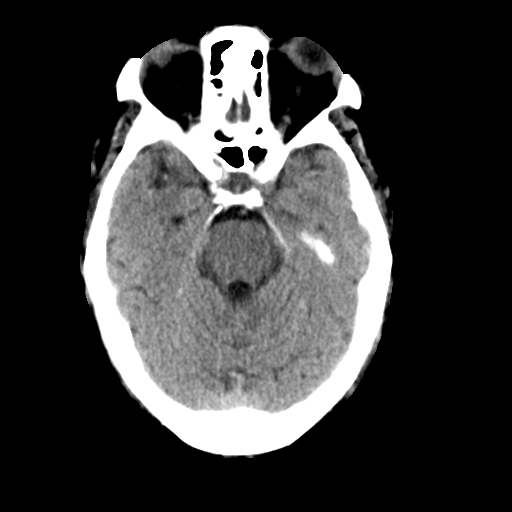
[im 18/34  brain]
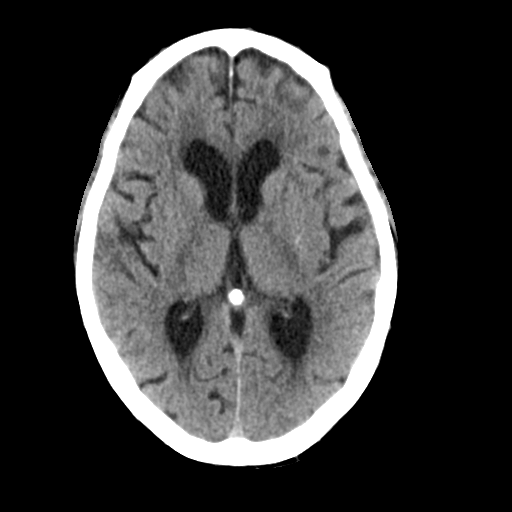
[im 18/34  bone]
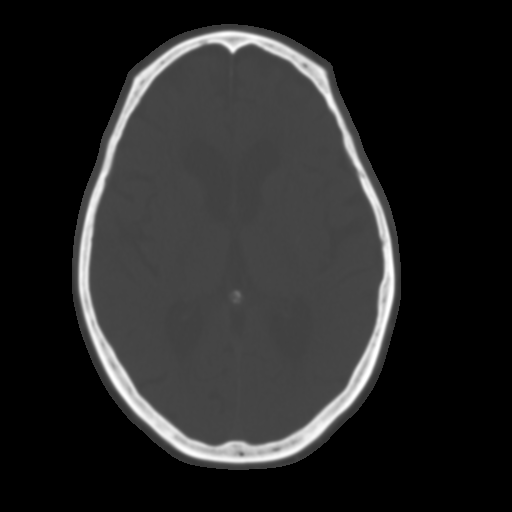
[im 21/34  brain]
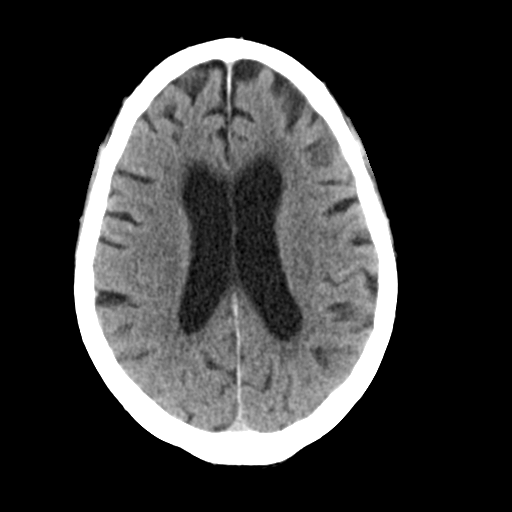
[im 24/34  brain]
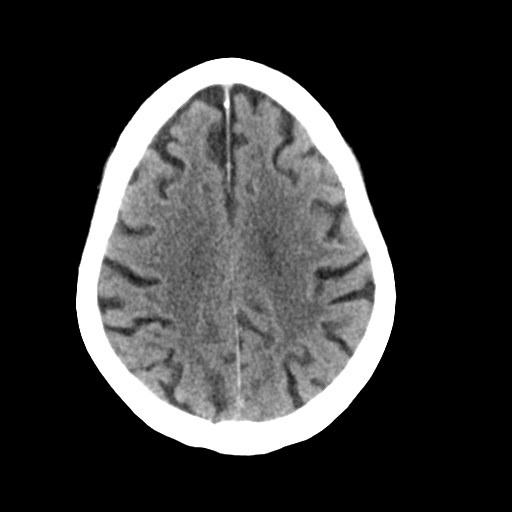
[im 28/34  brain]
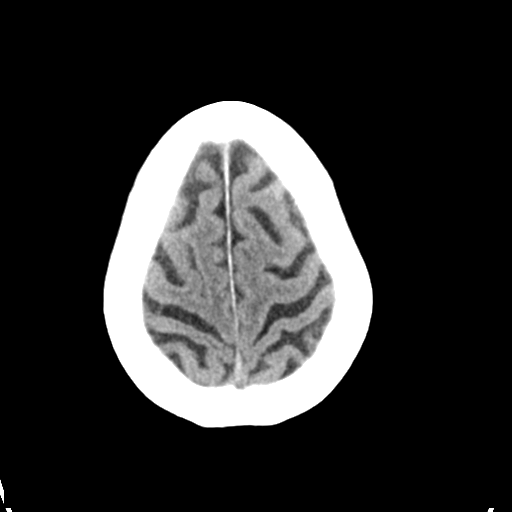
[im 31/34  brain]
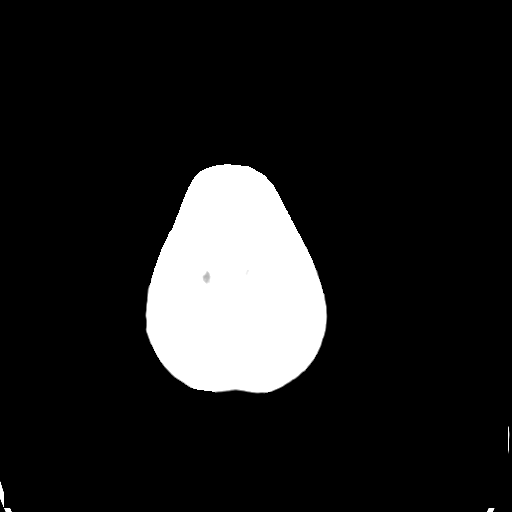
[im 31/34  bone]
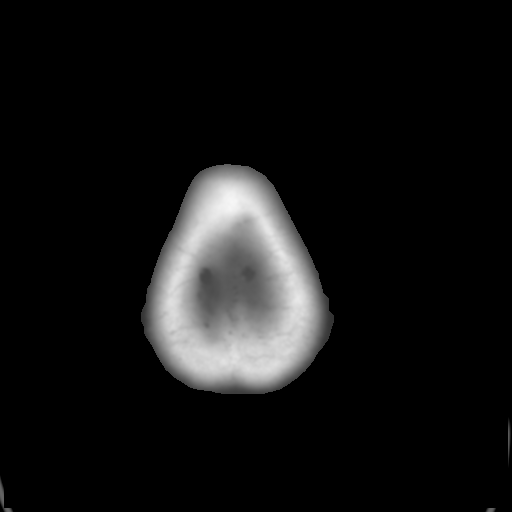

[Series 5: head 3.0 mpr cor · coronal · 0.35mm/px · 3 of 71 slices shown]
[im 24/71  brain]
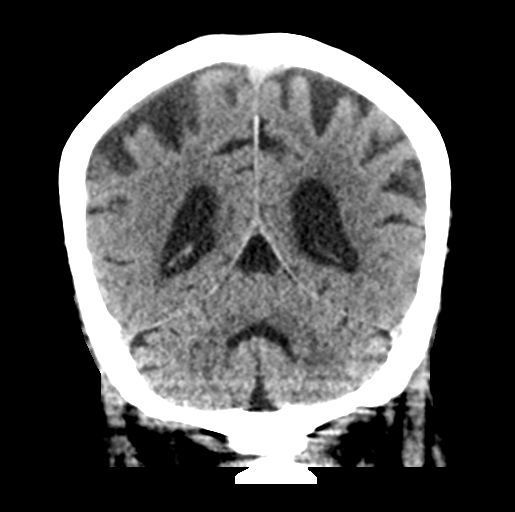
[im 32/71  brain]
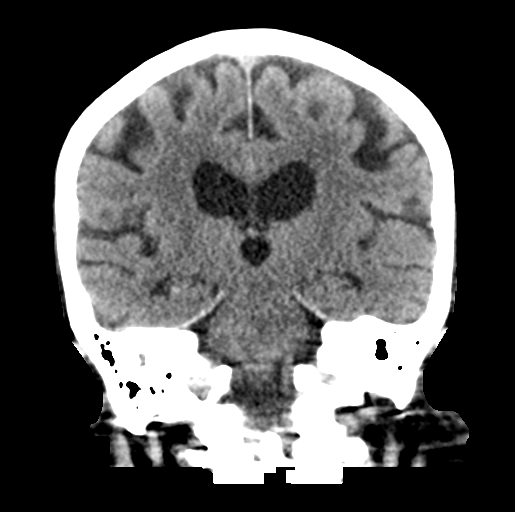
[im 39/71  brain]
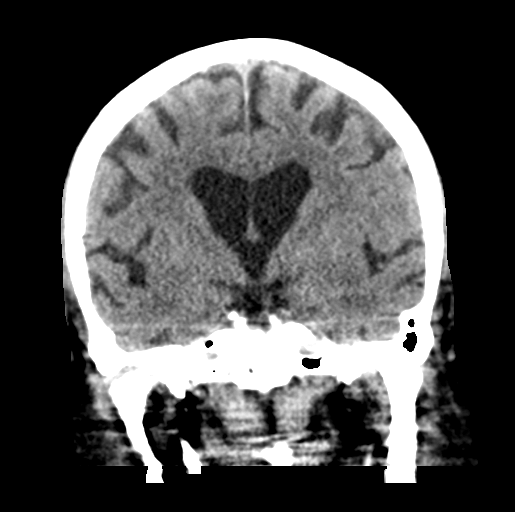

[Series 6: head 3.0 mpr sag · sagittal · 0.33mm/px · 3 of 67 slices shown]
[im 23/67  brain]
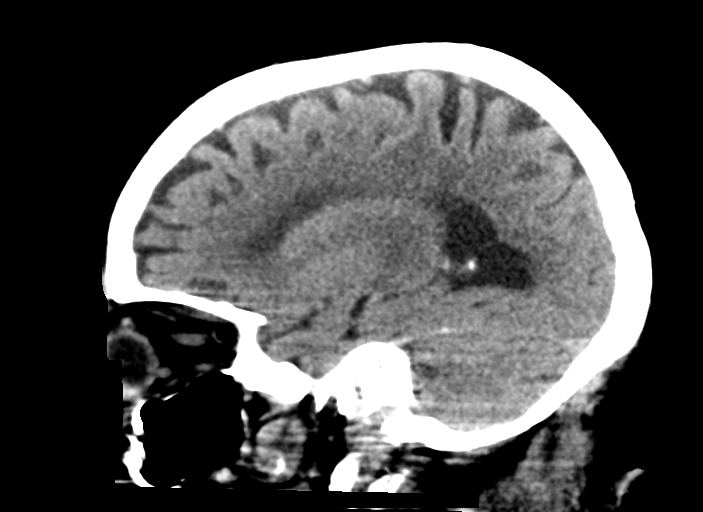
[im 34/67  brain]
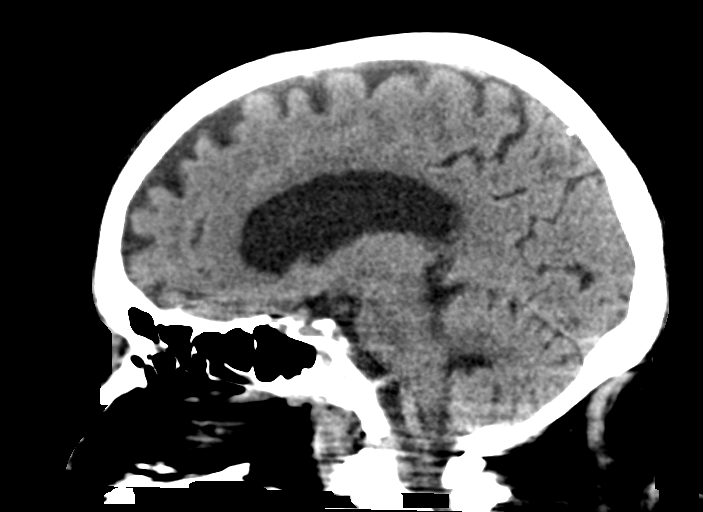
[im 45/67  brain]
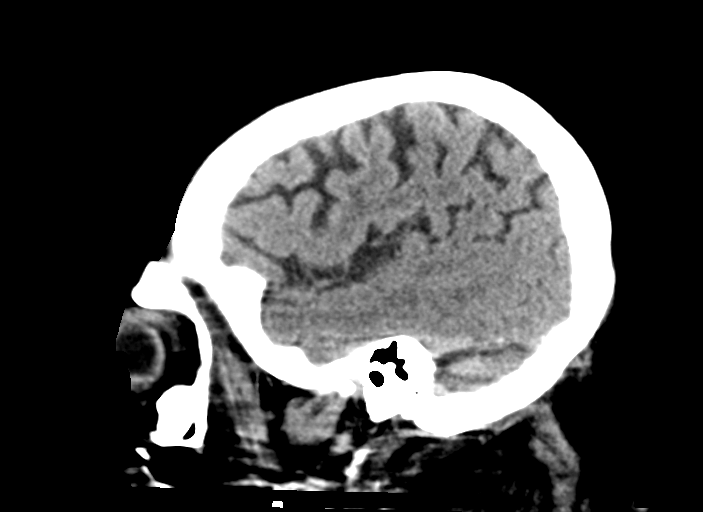

[15 of 47 positions shown; findings below may reference images not displayed]

FINDINGS: Brain: There is no acute intracranial hemorrhage, mass effect, or
edema. Gray-white differentiation is preserved. There is no
extra-axial fluid collection. Ventricles and sulci are within normal
limits in size and configuration.

Vascular: There is atherosclerotic calcification at the skull base.

Skull: Calvarium is unremarkable.

Sinuses/Orbits: Mild mucosal thickening.

Other: Right greater than left mastoid opacification. Right middle
ear opacification.
IMPRESSION: No acute intracranial abnormality.

Nonspecific right greater than left mastoid and middle ear
opacification.
